# Patient Record
Sex: Female | Born: 1955 | Race: Black or African American | Hispanic: No | Smoking: Never smoker
Health system: Southern US, Community
[De-identification: ages and names within clinical notes are randomized; demographics above are authoritative.]

## PROBLEM LIST (undated history)

## (undated) DIAGNOSIS — N879 Dysplasia of cervix uteri, unspecified: Secondary | ICD-10-CM

## (undated) DIAGNOSIS — D219 Benign neoplasm of connective and other soft tissue, unspecified: Secondary | ICD-10-CM

## (undated) DIAGNOSIS — N946 Dysmenorrhea, unspecified: Secondary | ICD-10-CM

## (undated) DIAGNOSIS — E785 Hyperlipidemia, unspecified: Secondary | ICD-10-CM

## (undated) DIAGNOSIS — E559 Vitamin D deficiency, unspecified: Secondary | ICD-10-CM

## (undated) HISTORY — DX: Vitamin D deficiency, unspecified: E55.9

## (undated) HISTORY — DX: Dysmenorrhea, unspecified: N94.6

## (undated) HISTORY — PX: FOOT SURGERY: SHX648

## (undated) HISTORY — PX: TUBAL LIGATION: SHX77

## (undated) HISTORY — PX: COLPOSCOPY: SHX161

## (undated) HISTORY — DX: Dysplasia of cervix uteri, unspecified: N87.9

## (undated) HISTORY — DX: Benign neoplasm of connective and other soft tissue, unspecified: D21.9

## (undated) HISTORY — DX: Hyperlipidemia, unspecified: E78.5

## (undated) HISTORY — PX: CATARACT EXTRACTION: SUR2

---

## 1997-02-17 HISTORY — PX: VAGINAL HYSTERECTOMY: SUR661

## 1997-02-17 HISTORY — PX: TOTAL VAGINAL HYSTERECTOMY: SHX2548

## 1997-06-22 ENCOUNTER — Other Ambulatory Visit: Admission: RE | Admit: 1997-06-22 | Discharge: 1997-06-22 | Payer: Self-pay | Admitting: Obstetrics and Gynecology

## 1997-08-08 ENCOUNTER — Inpatient Hospital Stay (HOSPITAL_COMMUNITY): Admission: EM | Admit: 1997-08-08 | Discharge: 1997-08-09 | Payer: Self-pay | Admitting: Obstetrics and Gynecology

## 1998-03-29 ENCOUNTER — Other Ambulatory Visit: Admission: RE | Admit: 1998-03-29 | Discharge: 1998-03-29 | Payer: Self-pay | Admitting: Obstetrics and Gynecology

## 1999-04-16 ENCOUNTER — Other Ambulatory Visit: Admission: RE | Admit: 1999-04-16 | Discharge: 1999-04-16 | Payer: Self-pay | Admitting: Obstetrics and Gynecology

## 2000-03-25 ENCOUNTER — Ambulatory Visit (HOSPITAL_COMMUNITY): Admission: RE | Admit: 2000-03-25 | Discharge: 2000-03-25 | Payer: Self-pay | Admitting: Obstetrics and Gynecology

## 2000-03-25 ENCOUNTER — Encounter: Payer: Self-pay | Admitting: Obstetrics and Gynecology

## 2000-04-16 ENCOUNTER — Other Ambulatory Visit: Admission: RE | Admit: 2000-04-16 | Discharge: 2000-04-16 | Payer: Self-pay | Admitting: Obstetrics and Gynecology

## 2001-04-01 ENCOUNTER — Ambulatory Visit (HOSPITAL_COMMUNITY): Admission: RE | Admit: 2001-04-01 | Discharge: 2001-04-01 | Payer: Self-pay | Admitting: Obstetrics and Gynecology

## 2001-04-01 ENCOUNTER — Encounter: Payer: Self-pay | Admitting: Obstetrics and Gynecology

## 2001-04-16 ENCOUNTER — Other Ambulatory Visit: Admission: RE | Admit: 2001-04-16 | Discharge: 2001-04-16 | Payer: Self-pay | Admitting: Obstetrics and Gynecology

## 2002-04-04 ENCOUNTER — Ambulatory Visit (HOSPITAL_COMMUNITY): Admission: RE | Admit: 2002-04-04 | Discharge: 2002-04-04 | Payer: Self-pay | Admitting: Obstetrics and Gynecology

## 2002-04-04 ENCOUNTER — Encounter: Payer: Self-pay | Admitting: Obstetrics and Gynecology

## 2002-04-19 ENCOUNTER — Other Ambulatory Visit: Admission: RE | Admit: 2002-04-19 | Discharge: 2002-04-19 | Payer: Self-pay | Admitting: Obstetrics and Gynecology

## 2003-04-14 ENCOUNTER — Ambulatory Visit (HOSPITAL_COMMUNITY): Admission: RE | Admit: 2003-04-14 | Discharge: 2003-04-14 | Payer: Self-pay | Admitting: Obstetrics and Gynecology

## 2003-04-19 ENCOUNTER — Other Ambulatory Visit: Admission: RE | Admit: 2003-04-19 | Discharge: 2003-04-19 | Payer: Self-pay | Admitting: Obstetrics and Gynecology

## 2004-05-24 ENCOUNTER — Other Ambulatory Visit: Admission: RE | Admit: 2004-05-24 | Discharge: 2004-05-24 | Payer: Self-pay | Admitting: Addiction Medicine

## 2005-06-16 ENCOUNTER — Other Ambulatory Visit: Admission: RE | Admit: 2005-06-16 | Discharge: 2005-06-16 | Payer: Self-pay | Admitting: Obstetrics and Gynecology

## 2006-07-20 ENCOUNTER — Other Ambulatory Visit: Admission: RE | Admit: 2006-07-20 | Discharge: 2006-07-20 | Payer: Self-pay | Admitting: Obstetrics and Gynecology

## 2007-01-07 ENCOUNTER — Ambulatory Visit: Payer: Self-pay | Admitting: Family Medicine

## 2007-01-27 ENCOUNTER — Ambulatory Visit: Payer: Self-pay | Admitting: Gastroenterology

## 2007-01-27 LAB — CONVERTED CEMR LAB
Ferritin: 49.5 ng/mL (ref 10.0–291.0)
Hgb F Quant: 1.1 % (ref 0.0–2.0)
Hgb S Quant: 0 % (ref 0.0–0.0)

## 2007-02-03 ENCOUNTER — Emergency Department (HOSPITAL_COMMUNITY): Admission: EM | Admit: 2007-02-03 | Discharge: 2007-02-03 | Payer: Self-pay | Admitting: Emergency Medicine

## 2007-02-15 DIAGNOSIS — R141 Gas pain: Secondary | ICD-10-CM | POA: Insufficient documentation

## 2007-02-15 DIAGNOSIS — R079 Chest pain, unspecified: Secondary | ICD-10-CM | POA: Insufficient documentation

## 2007-02-15 DIAGNOSIS — R142 Eructation: Secondary | ICD-10-CM

## 2007-02-15 DIAGNOSIS — K5909 Other constipation: Secondary | ICD-10-CM

## 2007-02-15 DIAGNOSIS — R143 Flatulence: Secondary | ICD-10-CM

## 2007-02-25 ENCOUNTER — Ambulatory Visit: Payer: Self-pay | Admitting: Gastroenterology

## 2007-07-08 ENCOUNTER — Ambulatory Visit (HOSPITAL_COMMUNITY): Admission: RE | Admit: 2007-07-08 | Discharge: 2007-07-08 | Payer: Self-pay | Admitting: Obstetrics and Gynecology

## 2007-09-02 ENCOUNTER — Other Ambulatory Visit: Admission: RE | Admit: 2007-09-02 | Discharge: 2007-09-02 | Payer: Self-pay | Admitting: Obstetrics and Gynecology

## 2007-12-10 ENCOUNTER — Ambulatory Visit: Payer: Self-pay | Admitting: Family Medicine

## 2008-09-15 ENCOUNTER — Encounter: Payer: Self-pay | Admitting: Obstetrics and Gynecology

## 2008-09-15 ENCOUNTER — Other Ambulatory Visit: Admission: RE | Admit: 2008-09-15 | Discharge: 2008-09-15 | Payer: Self-pay | Admitting: Obstetrics and Gynecology

## 2008-09-15 ENCOUNTER — Ambulatory Visit: Payer: Self-pay | Admitting: Obstetrics and Gynecology

## 2008-12-17 IMAGING — CR DG CHEST 2V
2 series · 2 of 2 positions shown · non-contrast
Comparison: None

CLINICAL DATA: Chest pain

CHEST - 2 VIEW:

[w chest pa]
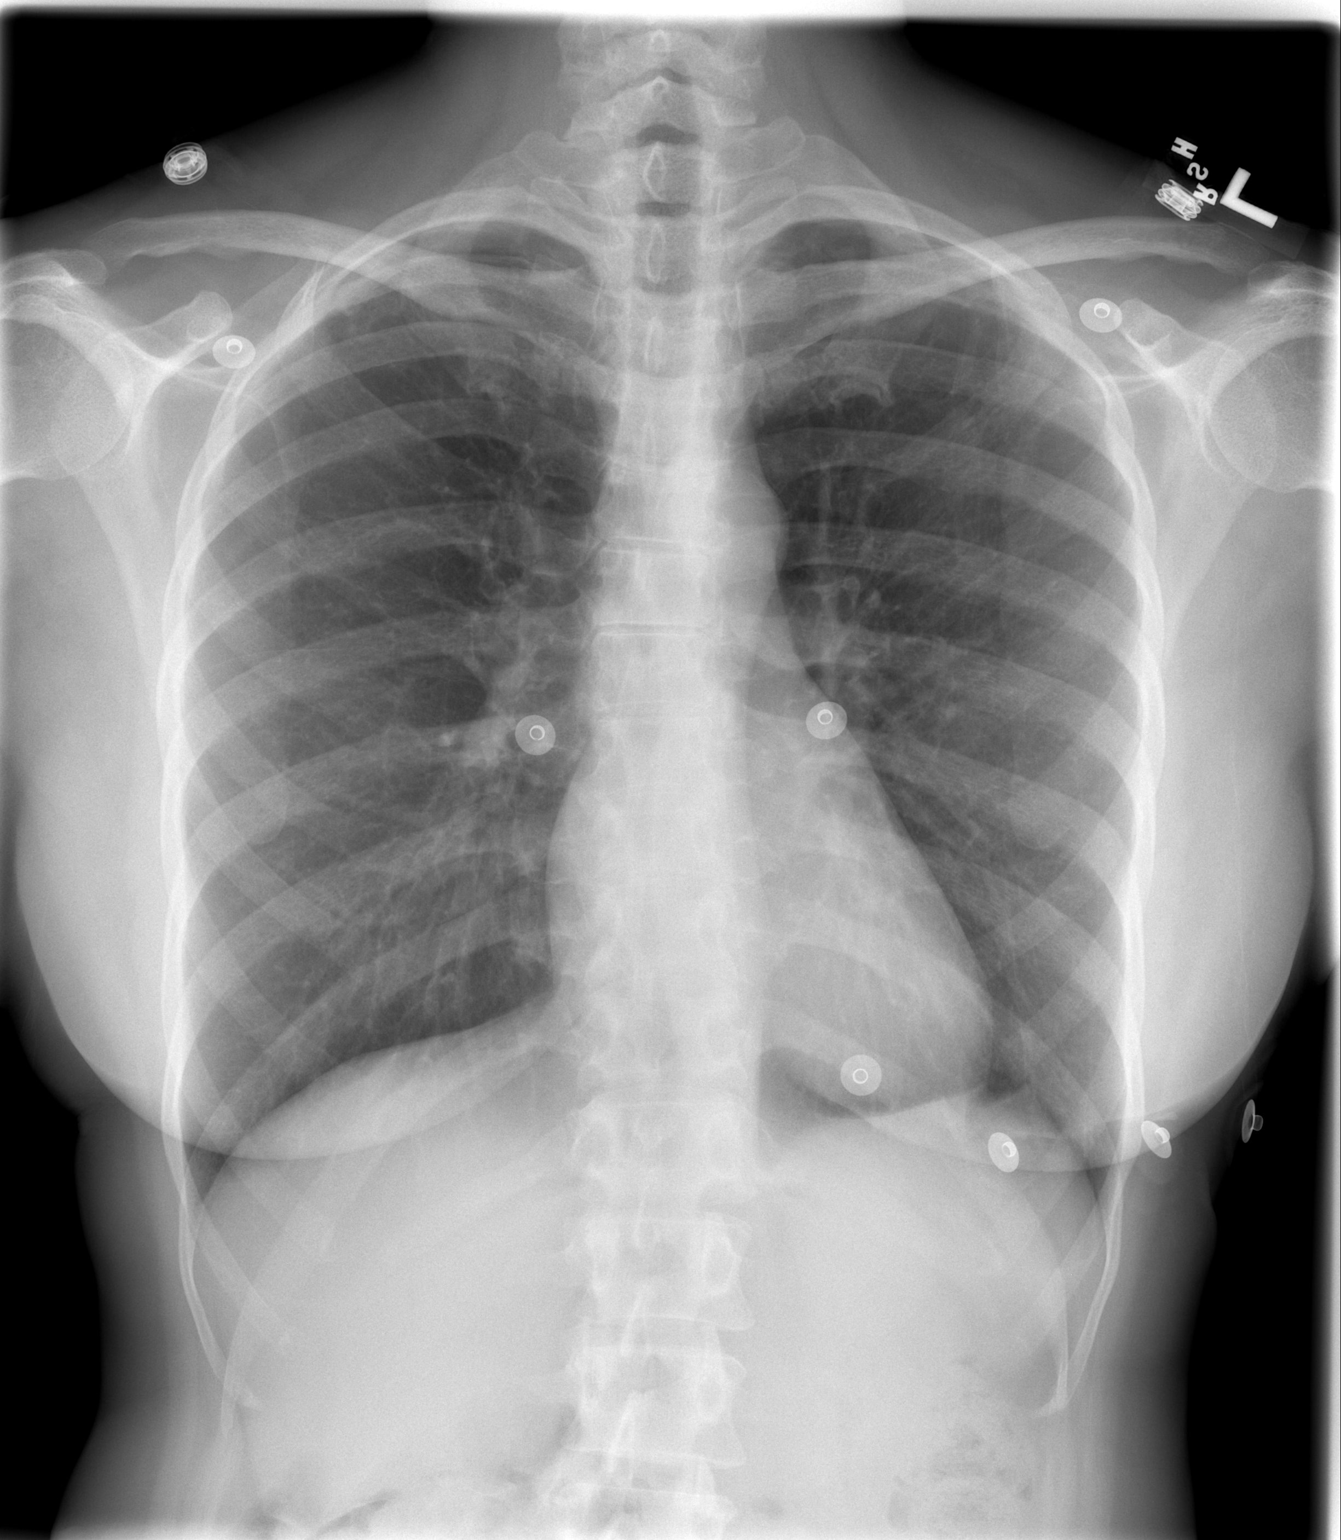

[w chest lat]
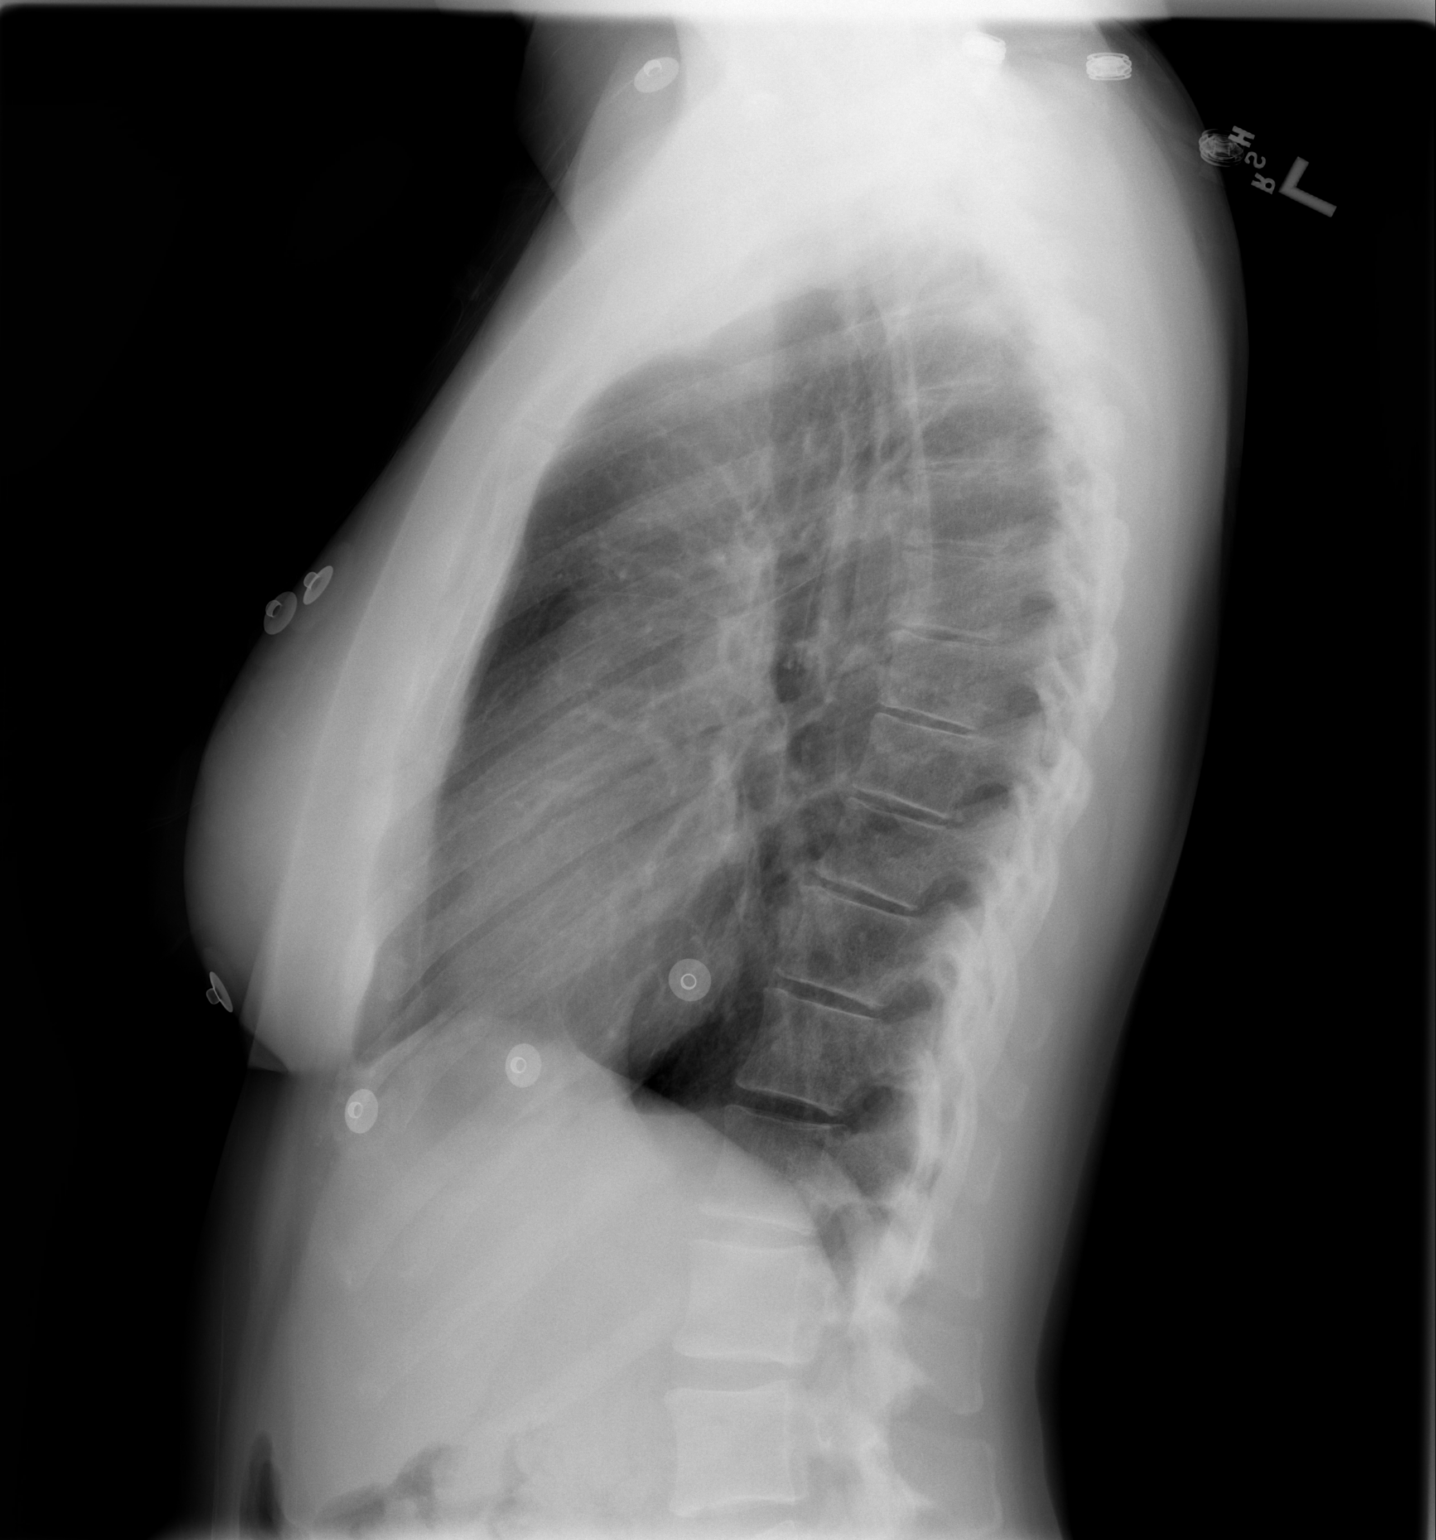

[2 of 2 positions shown; findings below may reference images not displayed]

FINDINGS: The heart size and mediastinal contours are within normal limits. 
Biapical scarring noted. Otherwise lungs are clear. . No effusions.  The
visualized skeletal structures are unremarkable.
IMPRESSION: No active cardiopulmonary disease

## 2008-12-21 ENCOUNTER — Ambulatory Visit (HOSPITAL_COMMUNITY): Admission: RE | Admit: 2008-12-21 | Discharge: 2008-12-21 | Payer: Self-pay | Admitting: Obstetrics and Gynecology

## 2008-12-27 ENCOUNTER — Encounter: Admission: RE | Admit: 2008-12-27 | Discharge: 2008-12-27 | Payer: Self-pay | Admitting: Obstetrics and Gynecology

## 2009-01-02 ENCOUNTER — Ambulatory Visit: Payer: Self-pay | Admitting: Family Medicine

## 2009-07-02 ENCOUNTER — Encounter: Admission: RE | Admit: 2009-07-02 | Discharge: 2009-07-02 | Payer: Self-pay | Admitting: Obstetrics and Gynecology

## 2009-09-26 ENCOUNTER — Other Ambulatory Visit: Admission: RE | Admit: 2009-09-26 | Discharge: 2009-09-26 | Payer: Self-pay | Admitting: Obstetrics and Gynecology

## 2009-09-26 ENCOUNTER — Ambulatory Visit: Payer: Self-pay | Admitting: Obstetrics and Gynecology

## 2010-01-14 ENCOUNTER — Ambulatory Visit: Payer: Self-pay | Admitting: Obstetrics and Gynecology

## 2010-03-10 ENCOUNTER — Encounter: Payer: Self-pay | Admitting: Obstetrics and Gynecology

## 2010-07-02 NOTE — Letter (Signed)
January 27, 2007    Orlin Hilding   RE:  ELEANOR, GATLIFF  MRN:  [id number]  /  DOB:  06/23/1955   Dear Mrs. Neva Seat:   It is my pleasure to have treated you recently as a new patient in my  office.  I appreciate your confidence and the opportunity to participate  in your care.   Since I do have a busy inpatient endoscopy schedule and office schedule,  my office hours vary weekly.  I am, however, available for emergency  calls every day through my office.  If I cannot promptly meet an urgent  office appointment, another one of our gastroenterologists will be able  to assist you.   My well-trained staff are prepared to help you at all times.  For  emergencies after office hours, a physician from our gastroenterology  section is always available through my 24-hour answering service.   While you are under my care, I encourage discussion of your questions  and concerns, and I will be happy to return your calls as soon as I am  available.   Once again, I welcome you as a new patient and I look forward to a happy  and healthy relationship.    Sincerely,      Barbette Hair. Arlyce Dice, MD,FACG  Electronically Signed   RDK/MedQ  DD: 01/27/2007  DT: 01/28/2007  Job #: [id number]

## 2010-07-02 NOTE — Assessment & Plan Note (Signed)
[facility]                         GASTROENTEROLOGY OFFICE NOTE   NAME:Roman, Barbara LOOPER                     MRN:          [id number]  DATE:01/27/2007                            DOB:          Dec 14, 1955    REASON FOR CONSULTATION:  Constipation.   Barbara Roman is a pleasant 55 year old African American female referred  through the courtesy of Dr. Susann Givens for evaluation.  For years, she has  been suffering from constipation.  She has to take a cathartic to have a  bowel movement, or she may go up to a week without a bowel movement.  She has abdominal distention and discomfort associated with this  problem.  There is no history of melena or hematochezia.  She does  complain of abdominal bloating with her constipation.  Recent lab work  is pertinent for a hemoglobin of 13.1, with an MCV of 77.  Barbara Roman  reports that a sister has what sounds like sickle cell trait.  She  denies melena or hematochezia.  She underwent a hysterectomy 20 years  ago and has had no vaginal bleeding.   PAST MEDICAL HISTORY:  1. Hysterectomy.  2. Tubal ligation.   FAMILY HISTORY:  Positive for a mother with uterine cancer, and father  with heart disease and diabetes.   MEDICATIONS:  Premarin.   She has no allergies.   She neither smokes nor drinks.   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   REVIEW OF SYSTEMS:  Positive for joint and back pain and muscle cramps.   PHYSICAL EXAMINATION:  VITAL SIGNS:  Pulse 80, blood pressure 100/70,  weight 122.  HEENT: EOMI.  PERRLA.  Sclerae are anicteric.  Conjunctivae are pink.  NECK:  Supple without thyromegaly, adenopathy or carotid bruits.  CHEST:  Clear to auscultation and percussion without adventitious  sounds.  CARDIAC:  Regular rhythm; normal S1 S2.  There are no murmurs, gallops  or rubs.  ABDOMEN:  Bowel sounds are normoactive.  Abdomen is soft, nontender and  nondistended.  There are no abdominal  masses, tenderness, splenic  enlargement or hepatomegaly.  EXTREMITIES:  Full range of motion.  No cyanosis, clubbing or edema.  RECTAL:  Deferred.   IMPRESSION:  1. Constipation.  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  2. Microcytosis.  I suspect she may have sickle trait or perhaps      thalassemia trait.  Iron-deficiency is another consideration.   RECOMMENDATIONS:  1. Fiber supplementation.  2. Colonoscopy.  3. Check hemoglobin electrophoresis, iron, and ferritin levels.  4. If the colonoscopy is negative, I will work on a bowel regimen with      Barbara Roman.     Barbette Hair. Arlyce Dice, MD,FACG  Electronically Signed    RDK/MedQ  DD: 01/27/2007  DT: 01/28/2007  Job #: [id number]   cc:   Sharlot Gowda, M.D.

## 2010-07-02 NOTE — Letter (Signed)
January 27, 2007    Sharlot Gowda, M.D.  [address]  [place], [place] [address]   RE:  Barbara Roman, Barbara Roman  MRN:  [id number]  /  DOB:  Oct 16, 1955   Dear Dr. Susann Givens,   Upon your kind referral, I had the pleasure of evaluating your patient  and I am pleased to offer my findings.  I saw Orlin Hilding in the  office today.  Enclosed is a copy of my progress note that details my  findings and recommendations.   Thank you for the opportunity to participate in your patient's care.    Sincerely,      Barbette Hair. Arlyce Dice, MD,FACG  Electronically Signed    RDK/MedQ  DD: 01/27/2007  DT: 01/28/2007  Job #: [id number]

## 2010-07-29 ENCOUNTER — Other Ambulatory Visit: Payer: Self-pay | Admitting: Obstetrics and Gynecology

## 2010-07-29 DIAGNOSIS — Z1231 Encounter for screening mammogram for malignant neoplasm of breast: Secondary | ICD-10-CM

## 2010-08-06 ENCOUNTER — Ambulatory Visit (HOSPITAL_COMMUNITY)
Admission: RE | Admit: 2010-08-06 | Discharge: 2010-08-06 | Disposition: A | Discharge status: Home / Self Care | Payer: BC Managed Care – PPO | Source: Ambulatory Visit | Attending: Obstetrics and Gynecology | Admitting: Obstetrics and Gynecology

## 2010-08-06 DIAGNOSIS — Z1231 Encounter for screening mammogram for malignant neoplasm of breast: Secondary | ICD-10-CM | POA: Insufficient documentation

## 2010-09-30 ENCOUNTER — Encounter: Payer: BC Managed Care – PPO | Admitting: Obstetrics and Gynecology

## 2010-10-09 ENCOUNTER — Encounter: Payer: BC Managed Care – PPO | Admitting: Obstetrics and Gynecology

## 2010-10-14 ENCOUNTER — Encounter: Payer: Self-pay | Admitting: Gynecology

## 2010-10-14 DIAGNOSIS — D219 Benign neoplasm of connective and other soft tissue, unspecified: Secondary | ICD-10-CM | POA: Insufficient documentation

## 2010-10-14 DIAGNOSIS — N946 Dysmenorrhea, unspecified: Secondary | ICD-10-CM | POA: Insufficient documentation

## 2010-10-23 ENCOUNTER — Other Ambulatory Visit: Payer: Self-pay

## 2010-10-23 ENCOUNTER — Encounter: Payer: Self-pay | Admitting: Obstetrics and Gynecology

## 2010-10-23 ENCOUNTER — Other Ambulatory Visit (HOSPITAL_COMMUNITY)
Admission: RE | Admit: 2010-10-23 | Discharge: 2010-10-23 | Disposition: A | Discharge status: Home / Self Care | Payer: BC Managed Care – PPO | Source: Ambulatory Visit | Attending: Obstetrics and Gynecology | Admitting: Obstetrics and Gynecology

## 2010-10-23 ENCOUNTER — Ambulatory Visit (INDEPENDENT_AMBULATORY_CARE_PROVIDER_SITE_OTHER): Payer: BC Managed Care – PPO | Admitting: Obstetrics and Gynecology

## 2010-10-23 VITALS — BP 114/70 | Ht 63.0 in | Wt 132.0 lb

## 2010-10-23 DIAGNOSIS — Z01419 Encounter for gynecological examination (general) (routine) without abnormal findings: Secondary | ICD-10-CM

## 2010-10-23 DIAGNOSIS — B373 Candidiasis of vulva and vagina: Secondary | ICD-10-CM

## 2010-10-23 DIAGNOSIS — N951 Menopausal and female climacteric states: Secondary | ICD-10-CM

## 2010-10-23 DIAGNOSIS — E7800 Pure hypercholesterolemia, unspecified: Secondary | ICD-10-CM

## 2010-10-23 DIAGNOSIS — Z780 Asymptomatic menopausal state: Secondary | ICD-10-CM

## 2010-10-23 DIAGNOSIS — Z833 Family history of diabetes mellitus: Secondary | ICD-10-CM

## 2010-10-23 MED ORDER — TERCONAZOLE 0.8 % VA CREA: 1.0000 | TOPICAL_CREAM | Freq: Every day | VAGINAL | Status: AC

## 2010-10-23 MED ORDER — ESTRADIOL 1 MG PO TABS: 1.0000 mg | ORAL_TABLET | Freq: Every day | ORAL | Status: DC

## 2010-10-23 NOTE — Progress Notes (Signed)
Patient came to see me today for an annual GYN exam. She continues to do well on her estradiol. She still has hot flashes 2-3 times a month but thinks they're tolerable. She is up-to-date on mammograms and bone densities. She says she does go a yeast infections. She is having no vaginal bleeding and no pelvic pain.  Past medical history, family history, and social history reviewed and in chart.  ROS: other pertinent positives in addition to above included some weight gain, diminished hair growth, belching and constipation.  HEENT: Within normal limits. Neck: No masses. Supraclavicular lymph nodes: Not enlarged. Breasts: Examined in both sitting and lying position. Symmetrical without skin changes or masses. Abdomen: Soft no masses guarding or rebound. No hernias. Pelvic: External within normal limits. BUS within normal limits. Vaginal examination shows good estrogen effect, no cystocele enterocele or rectocele. Cervix and uterus absent. Adnexa within normal limits. Rectovaginal confirmatory. Extremities within normal limits.   Assessment: 1. Menopausal symptoms 2. Recurrent yeast infections  Plan: Discussed switching to patch estrogen. Patient to research the cost and will let me know. For the moment she'll stay on oral estradiol. Discussed oral rephresh. Prescription for terconazole 3 cream given.

## 2010-10-28 ENCOUNTER — Other Ambulatory Visit: Payer: Self-pay | Admitting: Obstetrics and Gynecology

## 2010-11-22 LAB — I-STAT 8, (EC8 V) (CONVERTED LAB)
Acid-base deficit: 1
HCT: 41
Hemoglobin: 13.9
Operator id: 198171
Potassium: 4.3
Sodium: 137
TCO2: 25

## 2010-11-22 LAB — CBC
Hemoglobin: 12.1
RDW: 13.7

## 2010-11-22 LAB — DIFFERENTIAL
Basophils Absolute: 0
Lymphocytes Relative: 32
Monocytes Absolute: 0.4
Neutro Abs: 2.9

## 2010-11-22 LAB — D-DIMER, QUANTITATIVE: D-Dimer, Quant: <0.22

## 2011-02-21 ENCOUNTER — Telehealth: Payer: Self-pay | Admitting: *Deleted

## 2011-02-21 MED ORDER — ESTROGENS CONJUGATED 0.625 MG PO TABS: 0.6250 mg | ORAL_TABLET | Freq: Every day | ORAL | Status: DC

## 2011-02-21 NOTE — Telephone Encounter (Signed)
Pt called stating that her estradiol 1 mg is on back order until mid Jan. Per DG protocol pt can have premarin .625 which is the equivalent dose of 1 mg estradiol. Rx will be sent to pharmacy, pt informed with this as well.

## 2011-02-26 ENCOUNTER — Encounter: Payer: Self-pay | Admitting: *Deleted

## 2011-02-26 NOTE — Progress Notes (Signed)
Patient ID: Barbara Roman, female   DOB: [birth year], 56 y.o.   MRN: [id number] Pt called wanting to know if we had samples of premarin in office, pt informed that no samples are available.

## 2011-03-18 ENCOUNTER — Other Ambulatory Visit: Payer: Self-pay | Admitting: *Deleted

## 2011-03-18 DIAGNOSIS — E785 Hyperlipidemia, unspecified: Secondary | ICD-10-CM

## 2011-06-12 ENCOUNTER — Encounter: Payer: Self-pay | Admitting: Family Medicine

## 2011-06-12 ENCOUNTER — Ambulatory Visit (INDEPENDENT_AMBULATORY_CARE_PROVIDER_SITE_OTHER): Payer: BC Managed Care – PPO | Admitting: Family Medicine

## 2011-06-12 VITALS — BP 116/80 | HR 75 | Wt 132.0 lb

## 2011-06-12 DIAGNOSIS — R142 Eructation: Secondary | ICD-10-CM

## 2011-06-12 DIAGNOSIS — K30 Functional dyspepsia: Secondary | ICD-10-CM

## 2011-06-12 DIAGNOSIS — K3189 Other diseases of stomach and duodenum: Secondary | ICD-10-CM

## 2011-06-12 DIAGNOSIS — R141 Gas pain: Secondary | ICD-10-CM

## 2011-06-12 NOTE — Progress Notes (Signed)
  Subjective:    Patient ID: Barbara Roman, female    DOB: 1955-12-20, 56 y.o.   MRN: [id number]  HPI She complains of a several year history of what excessive eructation. She cannot relate this to greasy foods, spicy foods, milk or milk products. She did try one Zantac yesterday and states that she did get some relief of her symptoms. She has had no nausea, vomiting, weight change.   Review of Systems     Objective:   Physical Exam Alert and in no distress. Cardiac exam shows regular rhythm without murmurs or gallops. Lungs are clear to auscultation. Abdominal exam shows normal bowel sounds without masses or tenderness. Negative Murphy sign and Murphy's punch       Assessment & Plan:  Excessive eructation, etiology unclear.   She is to keep track of her symptoms in regard to any foods or spices that might bring this on also milk and milk products. Recommend she try an H2 blocker of her choice and let me now how this works.

## 2011-06-12 NOTE — Patient Instructions (Signed)
See if greasy foods make a difference or milk or milk products make a difference. Try Zantac, Pepcid, Axid regularly for the next couple of weeks to see what that will do. Sometimes different spices can cause trouble

## 2011-10-10 ENCOUNTER — Ambulatory Visit (INDEPENDENT_AMBULATORY_CARE_PROVIDER_SITE_OTHER): Payer: BC Managed Care – PPO | Admitting: Medical

## 2011-10-10 ENCOUNTER — Encounter: Payer: Self-pay | Admitting: Medical

## 2011-10-10 VITALS — BP 120/70 | HR 58 | Temp 98.1°F | Resp 14 | Wt 124.0 lb

## 2011-10-10 DIAGNOSIS — K111 Hypertrophy of salivary gland: Secondary | ICD-10-CM

## 2011-10-10 DIAGNOSIS — H1045 Other chronic allergic conjunctivitis: Secondary | ICD-10-CM

## 2011-10-10 DIAGNOSIS — H1010 Acute atopic conjunctivitis, unspecified eye: Secondary | ICD-10-CM

## 2011-10-10 NOTE — Patient Instructions (Signed)
Begin Benadryl or Zyrtec at night for the next few weeks for possible allergies to grass or other pollen.  Begin Aleve twice daily the next 5-7 days for gland swelling.   Drink plenty of fluids.  We discussed possible diagnosis today. Given the possible diagnosis that could include mumps, avoid young children and adults with decreased immune systems.    If not improving in the next 1-2 weeks, or if new or worsening symptoms, call or return.   Mumps Mumps is an infection caused by a type of germ (virus). Mumps is usually seen in children between the ages of 56 to 71 years of age, but it can occur in older teenagers and adults. Mumps is common worldwide. Vaccination in one country may not protect you against the mumps virus in a different country. Your caregiver may have specific recommendations. CAUSES  The mumps is caused by direct contact with an infected person. The person you get the mumps from may not have had any symptoms at the time that you or your child came in contact with them. That is because the length of time between being exposed to an illness and when symptoms occur (incubation period) ranges from 2 to 3 weeks. SYMPTOMS   Painful enlargement of the salivary glands, especially the parotid gland. The parotid gland is a large gland that lies just behind the upper jaw. The swelling usually occurs over several days and often begins on one side of the face. The swelling goes away in about 1 week.   Muscular aches and pains.   Fever.   Headache.   Abdominal pain.   Loss of appetite.   General tiredness (malaise).   Males (usually over the age of 10 years) may have severe pain of their testicles on one or both sides.  DIAGNOSIS  A caregiver will usually perform a physical exam. Blood tests can help confirm the diagnosis when necessary. A caregiver will decide if any additional blood tests are necessary to look for rare complications of the illness. Generally, viral cultures and  lab tests are not needed.  TREATMENT Treatment is symptomatic. This means that treatment can only help improve symptoms. There is no medication to treat Mumps. HOME CARE INSTRUCTIONS   A caregiver may recommend immunizations if there is a mumps outbreak.   Keep the infected person away from others, especially those who have not had their full course of vaccines or are pregnant.   School or daycare should be avoided for 9 days from the onset of swollen glands or as directed by a caregiver.   Wash your hand well at home. This will help prevent the spread of the virus.   Get plenty of rest.   Drink enough fluids to keep your urine clear or pale yellow.   A soft diet is helpful for jaw pain.   Avoid food and fluids that are acidic as they will upset the stomach and worsen mouth pain. Examples include:   Orange juice.   Tomatoes.   Products containing vinegar.   Only take over-the-counter or prescription medicines for pain, discomfort, or fever as directed by your caregiver. Do not give aspirin to children.  SEEK MEDICAL CARE IF:   You or your child has an oral temperature above 102 F (38.9 C).   A severe headache develops.   You or your child has weakness.   You or your child becomes confused.   You or your child keeps throwing up (vomiting).   Ringing in the  ears develops.   You or your child has neck pain or stiff neck.   There is pain in the testicles.  MAKE SURE YOU:   Understand these instructions.   Will watch your condition.   Will get help right away if you are not doing well or get worse.  Document Released: 02/03/2005 Document Revised: 01/23/2011 Document Reviewed: 09/21/2008 [facility] Patient Information 2012 [place], [place].

## 2011-10-10 NOTE — Progress Notes (Signed)
Subjective: Here for 3 wk hx/o eyes puffy and now glands are swollen.  Eyes have been puffy for 3 wk, but in the last week she notes fever 99.5-99.7 in the evenings when she comes home from work, having some chills, and the neck and face glands are swollen.  Denies prior similar.  otherwise feels fine.    Past Medical History  Diagnosis Date  . Fibroid   . Dysmenorrhea    ROS Gen: no fatigue, no weight loss Skin: no rash HEENT: no sinus pressure, runny nose, watery eyes, ear pain, ST, cough Lungs: negative Heart: negative GI: negative Neuro: no weakness, numbness, tingling, headache  Objective: Gen: wd, wn, nad Skin: unremarkable HEENT: bilat preauricular area and along parotid gland with swollen shoddy nodes and mildly swollen bilat parotid glands.  Mildly tender.  No warmth, no lymph nodes > 1cm.  Head nontender, TMs pearly, nares patent, but turbinates pink and swollen, pharynx WNL Lungs: CTA   Assessment: Encounter Diagnoses  Name Primary?  . Parotid gland enlargement Yes  . Allergic conjunctivitis     Plan: Parotid gland enlargement - discussed possible differential, parotitis, viral, mumps, bacterial, or blocked parotid ducts.  Exam and hx/o favors viral or mumps.  Labs today to further eval.  Advised supportive care, aleve, hydrate well, and call if worse.  Advised if red, warm, swollen glands worrisome for bacterial infection or abscess to begin Augmentin.  Script written for just in case use.    Allergic conjunctivitis - begin Zyrtec or Benadryl QHS x 3-4 wk, recheck if not improving.

## 2011-11-12 ENCOUNTER — Encounter: Payer: Self-pay | Admitting: Obstetrics and Gynecology

## 2011-11-12 ENCOUNTER — Ambulatory Visit (INDEPENDENT_AMBULATORY_CARE_PROVIDER_SITE_OTHER): Payer: BC Managed Care – PPO | Admitting: Obstetrics and Gynecology

## 2011-11-12 VITALS — BP 118/74 | Ht 63.0 in | Wt 117.0 lb

## 2011-11-12 DIAGNOSIS — Z01419 Encounter for gynecological examination (general) (routine) without abnormal findings: Secondary | ICD-10-CM

## 2011-11-12 DIAGNOSIS — N879 Dysplasia of cervix uteri, unspecified: Secondary | ICD-10-CM | POA: Insufficient documentation

## 2011-11-12 MED ORDER — ESTRADIOL 1 MG PO TABS: 1.0000 mg | ORAL_TABLET | Freq: Every day | ORAL | Status: DC

## 2011-11-12 NOTE — Patient Instructions (Addendum)
Discuss  with aunt testing for BRCA1 and BRCA2. Schedule mammogram.

## 2011-11-12 NOTE — Progress Notes (Signed)
Patient came to see me today for her annual GYN exam. She remains at estradiol 1 mg for menopausal symptoms with excellent results. She had a normal bone density in 2009. She had a vaginal hysterectomy in 1999 for dysmenorrhea and fibroids. In 1992 we did a LEEP for cervical dysplasia. She has had normal Pap smears since then. Her cervix was benign at the time of her hysterectomy. Her last Pap smear was 2012. She is due for her mammogram. She has been under a lot of stress since her husband had a stroke this year. She is having no vaginal bleeding or pelvic pain. She had a maternal aunt who had breast cancer in her 66s. At that time she was not checked for BRCA1 or BRCA2. She is still alive and lives and [place]. She does her lab through PCP. She had normal bone density in 2009.  HEENT: Within normal limits.Kennon Portela present. Neck: No masses. Supraclavicular lymph nodes: Not enlarged. Breasts: Examined in both sitting and lying position. Symmetrical without skin changes or masses. Abdomen: Soft no masses guarding or rebound. No hernias. Pelvic: External within normal limits. BUS within normal limits. Vaginal examination shows good estrogen effect, no cystocele enterocele or rectocele. Cervix and uterus absent. Adnexa within normal limits. Rectovaginal confirmatory. Extremities within normal limits.  Assessment: #1. Menopausal symptoms #2. Cervical dysplasia #3. Maternal and with early onset breast cancer.  Plan: Mammogram. Patient to get aunt tested for BRCA1 and BRCA2 and inform me. She understands the increased risk of ovarian cancer if Maryem is Bracca  positive. Continue estradiol 1 mg daily.The new Pap smear guidelines were discussed with the patient. No pap done.

## 2011-11-13 LAB — URINALYSIS W MICROSCOPIC + REFLEX CULTURE
Bacteria, UA: NONE SEEN
Bilirubin Urine: NEGATIVE
Casts: NONE SEEN
Crystals: NONE SEEN
Ketones, ur: NEGATIVE mg/dL
Specific Gravity, Urine: 1.011 (ref 1.005–1.030)
Squamous Epithelial / LPF: NONE SEEN
pH: 6.5 (ref 5.0–8.0)

## 2011-11-17 ENCOUNTER — Encounter: Payer: Self-pay | Admitting: Obstetrics and Gynecology

## 2011-12-24 ENCOUNTER — Other Ambulatory Visit: Payer: Self-pay | Admitting: Obstetrics and Gynecology

## 2011-12-24 DIAGNOSIS — Z1231 Encounter for screening mammogram for malignant neoplasm of breast: Secondary | ICD-10-CM

## 2011-12-24 DIAGNOSIS — Z803 Family history of malignant neoplasm of breast: Secondary | ICD-10-CM

## 2012-01-05 ENCOUNTER — Telehealth: Payer: Self-pay | Admitting: *Deleted

## 2012-01-05 MED ORDER — ESTRADIOL 1 MG PO TABS: 1.0000 mg | ORAL_TABLET | Freq: Every day | ORAL | Status: DC

## 2012-01-05 NOTE — Telephone Encounter (Signed)
Pt called requesting refill on her estradiol 1 mg tablet. Rx sent.

## 2012-01-14 ENCOUNTER — Ambulatory Visit (HOSPITAL_COMMUNITY)
Admission: RE | Admit: 2012-01-14 | Discharge: 2012-01-14 | Disposition: A | Discharge status: Home / Self Care | Payer: BC Managed Care – PPO | Source: Ambulatory Visit | Attending: Obstetrics and Gynecology | Admitting: Obstetrics and Gynecology

## 2012-01-14 DIAGNOSIS — Z803 Family history of malignant neoplasm of breast: Secondary | ICD-10-CM

## 2012-01-14 DIAGNOSIS — Z1231 Encounter for screening mammogram for malignant neoplasm of breast: Secondary | ICD-10-CM | POA: Insufficient documentation

## 2012-04-16 ENCOUNTER — Encounter: Payer: Self-pay | Admitting: Medical

## 2012-04-16 ENCOUNTER — Ambulatory Visit (INDEPENDENT_AMBULATORY_CARE_PROVIDER_SITE_OTHER): Payer: BC Managed Care – PPO | Admitting: Medical

## 2012-04-16 VITALS — BP 100/70 | HR 73 | Temp 98.2°F | Resp 16 | Wt 119.0 lb

## 2012-04-16 DIAGNOSIS — R079 Chest pain, unspecified: Secondary | ICD-10-CM

## 2012-04-16 DIAGNOSIS — J069 Acute upper respiratory infection, unspecified: Secondary | ICD-10-CM

## 2012-04-16 NOTE — Progress Notes (Signed)
Subjective:  Barbara Roman is a 57 y.o. female who presents for cough, congestion, and chest pain.   She notes few day hx/o cough, congestion, runny nose, sneezing, congestion in her head, sleeping with vaporizer, using allergy and flu pills, tussin OTC.   She came is because she had a few brief episodes of chest pain in middle of chest, last seconds to minutes, not associated with SOB, nausea, sweats, dizziness, arm or jaw pain.  Denies sick contacts.  No other aggravating or relieving factors.  No other c/o.  Past Medical History  Diagnosis Date  . Dysmenorrhea   . Fibroid   . Cervical dysplasia    ROS as in subjective  Objective:  Filed Vitals:   04/16/12 1122  BP: 100/70  Pulse: 73  Temp: 98.2 F (36.8 C)  Resp: 16    General appearance: Alert, WD/WN, no distress, mildly ill appearing                             Skin: warm, no rash                           Head: no sinus tenderness                            Eyes: conjunctiva normal, corneas clear, PERRLA                            Ears: pearly TMs, external ear canals normal                          Nose: septum midline, turbinates swollen, with erythema and clear discharge             Mouth/throat: MMM, tongue normal, mild pharyngeal erythema                           Neck: supple, no adenopathy, no thyromegaly, nontender                          Heart: RRR, normal S1, S2, no murmurs                         Lungs: CTA bilaterally, no wheezes, rales, or rhonchi     Chest: nontender   Ext: no edema   Adult ECG Report  Indication: chest pain  Rate: 64 bpm  Rhythm: normal sinus rhythm  QRS Axis: 72  PR Interval: 118 ms  QRS Duration: 72ms  QTc: 379 ms  Conduction Disturbances: none  Other Abnormalities: none  Patient's cardiac risk factors are: none.  EKG comparison: 03/25/02 EKG, no changes  Narrative Interpretation: normal EKG, no acute changes    Assessment and Plan: Encounter Diagnoses  Name Primary?  .  URI (upper respiratory infection) Yes  . Chest pain     Discussed diagnosis and treatment of URI.  Suggested symptomatic OTC remedies.  Nasal saline spray for congestion.  Tylenol or Ibuprofen OTC for fever and malaise.  Call/return in 2-3 days if symptoms aren't resolving.   Chest pain - discussed symptoms, differential, and advised that symptoms dont' suggest cardiac origin.  Reviewed EKG , unchanged from prior.   Chest pain  likely due to chest congestion, reassured.

## 2012-04-20 NOTE — Addendum Note (Signed)
Addended by: Jac Canavan on: 04/20/2012 12:21 PM   Modules accepted: Orders

## 2012-11-12 ENCOUNTER — Ambulatory Visit (INDEPENDENT_AMBULATORY_CARE_PROVIDER_SITE_OTHER): Payer: BC Managed Care – PPO | Admitting: Gynecology

## 2012-11-12 ENCOUNTER — Encounter: Payer: Self-pay | Admitting: Gynecology

## 2012-11-12 VITALS — BP 112/70 | Ht 64.25 in | Wt 127.0 lb

## 2012-11-12 DIAGNOSIS — Z833 Family history of diabetes mellitus: Secondary | ICD-10-CM

## 2012-11-12 DIAGNOSIS — H6191 Disorder of right external ear, unspecified: Secondary | ICD-10-CM

## 2012-11-12 DIAGNOSIS — Z1159 Encounter for screening for other viral diseases: Secondary | ICD-10-CM

## 2012-11-12 DIAGNOSIS — H6190 Disorder of external ear, unspecified, unspecified ear: Secondary | ICD-10-CM

## 2012-11-12 DIAGNOSIS — Z01419 Encounter for gynecological examination (general) (routine) without abnormal findings: Secondary | ICD-10-CM

## 2012-11-12 DIAGNOSIS — Z79890 Hormone replacement therapy (postmenopausal): Secondary | ICD-10-CM

## 2012-11-12 LAB — CBC WITH DIFFERENTIAL/PLATELET
Eosinophils Absolute: 0.1 10*3/uL (ref 0.0–0.7)
Eosinophils Relative: 2 % (ref 0–5)
Hemoglobin: 13.5 g/dL (ref 12.0–15.0)
Lymphocytes Relative: 22 % (ref 12–46)
Lymphs Abs: 0.9 10*3/uL (ref 0.7–4.0)
MCH: 25.6 pg — ABNORMAL LOW (ref 26.0–34.0)
MCV: 74.4 fL — ABNORMAL LOW (ref 78.0–100.0)
Monocytes Relative: 11 % (ref 3–12)
Neutro Abs: 2.7 10*3/uL (ref 1.7–7.7)
Neutrophils Relative %: 64 % (ref 43–77)
Platelets: 283 10*3/uL (ref 150–400)
RBC: 5.28 MIL/uL — ABNORMAL HIGH (ref 3.87–5.11)
RDW: 14.3 % (ref 11.5–15.5)
WBC: 4.2 10*3/uL (ref 4.0–10.5)

## 2012-11-12 LAB — COMPREHENSIVE METABOLIC PANEL
ALT: 10 U/L (ref 0–35)
AST: 18 U/L (ref 0–37)
Alkaline Phosphatase: 92 U/L (ref 39–117)
BUN: 11 mg/dL (ref 6–23)
Chloride: 99 mEq/L (ref 96–112)
Creat: 0.75 mg/dL (ref 0.50–1.10)
Sodium: 138 mEq/L (ref 135–145)
Total Bilirubin: 0.4 mg/dL (ref 0.3–1.2)
Total Protein: 7.6 g/dL (ref 6.0–8.3)

## 2012-11-12 LAB — HEMOGLOBIN A1C
Hgb A1c MFr Bld: 6 % — ABNORMAL HIGH (ref <5.7)
Mean Plasma Glucose: 126 mg/dL — ABNORMAL HIGH (ref <117)

## 2012-11-12 LAB — CHOLESTEROL, TOTAL: Cholesterol: 234 mg/dL — ABNORMAL HIGH (ref 0–200)

## 2012-11-12 LAB — TSH: TSH: 3.84 u[IU]/mL (ref 0.350–4.500)

## 2012-11-12 MED ORDER — ESTRADIOL 1 MG PO TABS: 1.0000 mg | ORAL_TABLET | Freq: Every day | ORAL | Status: DC

## 2012-11-12 NOTE — Patient Instructions (Addendum)

## 2012-11-12 NOTE — Progress Notes (Signed)
Barbara Roman May 13, 1955 [id number]   History:    57 y.o.  for annual gyn exam when no complaints today. Patient has done well and asked about 1 mg by mouth daily for her menopausal symptoms. She had a vaginal hysterectomy in 1999 for dysmenorrhea and fibroids. The patient also has a history in 1992 for cervical LEEP conization for dysplasia and subsequent Pap smears have been normal. She had a maternal aunt who had breast cancer in her 78s. At that time she was not checked for BRCA1 or BRCA2. She is still alive and lives and [place]. Patient stated her colonoscopy in 5 years ago was normal. Her last mammogram was in November 2013 reported to be normal but dense patient declined flu vaccine. Last bone density study was normal in 2009. Her PCP is Dr. Sharlot Gowda.    Past medical history,surgical history, family history and social history were all reviewed and documented in the EPIC chart.  Gynecologic History Patient's last menstrual period was 07/29/1997. Contraception: status post hysterectomy Last Pap: 2012. Results were: normal Last mammogram: 2013. Results were: normal but dense  Obstetric History OB History  Gravida Para Term Preterm AB SAB TAB Ectopic Multiple Living  1 1 1       1     # Outcome Date GA Lbr Len/2nd Weight Sex Delivery Anes PTL Lv  1 TRM                ROS: A ROS was performed and pertinent positives and negatives are included in the history.  GENERAL: No fevers or chills. HEENT: No change in vision, no earache, sore throat or sinus congestion. NECK: No pain or stiffness.nodular area at the base of her right eardrum near her mandible CARDIOVASCULAR: No chest pain or pressure. No palpitations. PULMONARY: No shortness of breath, cough or wheeze. GASTROINTESTINAL: No abdominal pain, nausea, vomiting or diarrhea, melena or bright red blood per rectum. GENITOURINARY: No urinary frequency, urgency, hesitancy or dysuria. MUSCULOSKELETAL: No joint or muscle pain, no back  pain, no recent trauma. DERMATOLOGIC: No rash, no itching, no lesions. ENDOCRINE: No polyuria, polydipsia, no heat or cold intolerance. No recent change in weight. HEMATOLOGICAL: No anemia or easy bruising or bleeding. NEUROLOGIC: No headache, seizures, numbness, tingling or weakness. PSYCHIATRIC: No depression, no loss of interest in normal activity or change in sleep pattern.     Exam: chaperone present  BP 112/70  Ht 5' 4.25" (1.632 m)  Wt 127 lb (57.607 kg)  BMI 21.63 kg/m2  LMP 07/29/1997  Body mass index is 21.63 kg/(m^2).  General appearance : Well developed well nourished female. No acute distress HEENT: Neck supple, trachea midline, no carotid bruits, no thyroidmegaly, nodular area at the base of her right ear near her mandibleLungs: Clear to auscultation, no rhonchi or wheezes, or rib retractions  Heart: Regular rate and rhythm, no murmurs or gallops Breast:Examined in sitting and supine position were symmetrical in appearance, no palpable masses or tenderness,  no skin retraction, no nipple inversion, no nipple discharge, no skin discoloration, no axillary or supraclavicular lymphadenopathy Abdomen: no palpable masses or tenderness, no rebound or guarding Extremities: no edema or skin discoloration or tenderness  Pelvic:  Bartholin, Urethra, Skene Glands: Within normal limits             Vagina: No gross lesions or discharge  Cervix: absent  Uterus Absent  Adnexa  Without masses or tenderness  Anus and perineum  normal   Rectovaginal  normal sphincter tone without palpated  masses or tenderness             Hemoccult card provided     Assessment/Plan:  57 y.o. female for annual exam who 22 years ago had LEEP cervical conization for dysplasia. I do not have the pathology report as to the degree of dysplasia but through the years her Pap smears have been normal. We discussed a new screening guidelines. Pap smear not done today. Patient refused flu vaccine. Literature and  information on Tdap vaccine provided. The patient to schedule her bone density study. Patient next month will need a mammogram and I have recommended that she order a 3-D because of her dense breasts. She was reminded to submit to the office in medical car for testing. We discussed importance of monthly breast exam. The following labs were ordered: CBC, screen cholesterol, hemoglobin A1c, TSH, comprehensive metabolic panel and urinalysis. Patient will be referred to the ENT as a result of this nodular area that she noted at the base of her right ear near her right mandible and I concurred and we will await results of consultation.   Ok Edwards MD, 4:58 PM 11/12/2012

## 2012-11-13 LAB — URINALYSIS W MICROSCOPIC + REFLEX CULTURE
Bacteria, UA: NONE SEEN
Casts: NONE SEEN
Crystals: NONE SEEN
Glucose, UA: NEGATIVE mg/dL
Hgb urine dipstick: NEGATIVE
Ketones, ur: NEGATIVE mg/dL
Nitrite: NEGATIVE
Specific Gravity, Urine: 1.006 (ref 1.005–1.030)
Squamous Epithelial / LPF: NONE SEEN
pH: 6.5 (ref 5.0–8.0)

## 2012-11-13 LAB — HEPATITIS C ANTIBODY: HCV Ab: NEGATIVE

## 2012-11-15 ENCOUNTER — Other Ambulatory Visit: Payer: Self-pay | Admitting: Gynecology

## 2012-11-15 DIAGNOSIS — R739 Hyperglycemia, unspecified: Secondary | ICD-10-CM

## 2012-11-15 DIAGNOSIS — E7800 Pure hypercholesterolemia, unspecified: Secondary | ICD-10-CM

## 2012-12-07 ENCOUNTER — Other Ambulatory Visit: Payer: Self-pay | Admitting: Family Medicine

## 2012-12-07 DIAGNOSIS — Z1231 Encounter for screening mammogram for malignant neoplasm of breast: Secondary | ICD-10-CM

## 2012-12-17 ENCOUNTER — Other Ambulatory Visit: Payer: BC Managed Care – PPO

## 2012-12-17 ENCOUNTER — Other Ambulatory Visit: Payer: Self-pay | Admitting: *Deleted

## 2012-12-17 DIAGNOSIS — E7800 Pure hypercholesterolemia, unspecified: Secondary | ICD-10-CM

## 2012-12-17 DIAGNOSIS — R7309 Other abnormal glucose: Secondary | ICD-10-CM

## 2013-01-06 ENCOUNTER — Other Ambulatory Visit: Payer: Self-pay | Admitting: Gynecology

## 2013-01-06 ENCOUNTER — Ambulatory Visit (INDEPENDENT_AMBULATORY_CARE_PROVIDER_SITE_OTHER): Payer: BC Managed Care – PPO

## 2013-01-06 ENCOUNTER — Other Ambulatory Visit: Payer: BC Managed Care – PPO

## 2013-01-06 DIAGNOSIS — E7800 Pure hypercholesterolemia, unspecified: Secondary | ICD-10-CM

## 2013-01-06 DIAGNOSIS — Z79890 Hormone replacement therapy (postmenopausal): Secondary | ICD-10-CM

## 2013-01-06 DIAGNOSIS — Z13820 Encounter for screening for osteoporosis: Secondary | ICD-10-CM

## 2013-01-06 DIAGNOSIS — R7309 Other abnormal glucose: Secondary | ICD-10-CM

## 2013-01-06 LAB — LIPID PANEL
Cholesterol: 214 mg/dL — ABNORMAL HIGH (ref 0–200)
Total CHOL/HDL Ratio: 3.7 Ratio
VLDL: 12 mg/dL (ref 0–40)

## 2013-01-06 LAB — GLUCOSE, RANDOM: Glucose, Bld: 80 mg/dL (ref 70–99)

## 2013-01-07 ENCOUNTER — Other Ambulatory Visit: Payer: Self-pay | Admitting: Gynecology

## 2013-01-07 DIAGNOSIS — E7800 Pure hypercholesterolemia, unspecified: Secondary | ICD-10-CM

## 2013-01-18 ENCOUNTER — Ambulatory Visit (HOSPITAL_COMMUNITY): Payer: BC Managed Care – PPO

## 2013-02-03 ENCOUNTER — Ambulatory Visit (HOSPITAL_COMMUNITY): Payer: BC Managed Care – PPO

## 2013-02-07 ENCOUNTER — Ambulatory Visit (HOSPITAL_COMMUNITY): Payer: BC Managed Care – PPO

## 2013-02-08 ENCOUNTER — Ambulatory Visit (HOSPITAL_COMMUNITY)
Admission: RE | Admit: 2013-02-08 | Discharge: 2013-02-08 | Disposition: A | Discharge status: Home / Self Care | Payer: BC Managed Care – PPO | Source: Ambulatory Visit | Attending: Family Medicine | Admitting: Family Medicine

## 2013-02-08 DIAGNOSIS — Z1231 Encounter for screening mammogram for malignant neoplasm of breast: Secondary | ICD-10-CM | POA: Insufficient documentation

## 2013-03-07 ENCOUNTER — Ambulatory Visit (INDEPENDENT_AMBULATORY_CARE_PROVIDER_SITE_OTHER): Payer: BC Managed Care – PPO | Admitting: Family Medicine

## 2013-03-07 VITALS — BP 112/70 | HR 89 | Wt 125.0 lb

## 2013-03-07 DIAGNOSIS — M7710 Lateral epicondylitis, unspecified elbow: Secondary | ICD-10-CM

## 2013-03-07 DIAGNOSIS — M7711 Lateral epicondylitis, right elbow: Secondary | ICD-10-CM

## 2013-03-07 NOTE — Progress Notes (Signed)
   Subjective:    Patient ID: Barbara Roman, female    DOB: [birth year], 57 y.o.   MRN: [id number]  HPI She complains of a one-month history of difficulty with right elbow pain. She notes increased pain with certain physical activities. She has no history of injury or overuse.  Review of Systems     Objective:   Physical Exam Tender to palpation over the right lateral epicondyle. Provocative testing causing increased pain. Good motion of the elbow with no effusion.       Assessment & Plan:  Lateral epicondylitis of right elbow  recommend conservative care with heat, anti-inflammatory, do as many things pounds up and open. She will return here if continued difficulty.

## 2013-03-07 NOTE — Patient Instructions (Signed)
Do as many things as you can palms up and open. You can also use heat to your area for 20-30 minutes 3 times per day and take your favorite anti-inflammatory

## 2013-06-30 ENCOUNTER — Other Ambulatory Visit: Payer: BC Managed Care – PPO

## 2013-07-01 ENCOUNTER — Other Ambulatory Visit: Payer: BC Managed Care – PPO

## 2013-07-01 DIAGNOSIS — E7800 Pure hypercholesterolemia, unspecified: Secondary | ICD-10-CM

## 2013-07-01 LAB — LIPID PANEL
CHOLESTEROL: 214 mg/dL — AB (ref 0–200)
HDL: 68 mg/dL (ref >39)
LDL CALC: 136 mg/dL — AB (ref 0–99)
Total CHOL/HDL Ratio: 3.1 Ratio
Triglycerides: 50 mg/dL (ref <150)
VLDL: 10 mg/dL (ref 0–40)

## 2013-07-26 ENCOUNTER — Ambulatory Visit (INDEPENDENT_AMBULATORY_CARE_PROVIDER_SITE_OTHER): Payer: BC Managed Care – PPO | Admitting: Family Medicine

## 2013-07-26 ENCOUNTER — Encounter: Payer: Self-pay | Admitting: Family Medicine

## 2013-07-26 VITALS — BP 100/70 | HR 80 | Wt 127.0 lb

## 2013-07-26 DIAGNOSIS — S29011A Strain of muscle and tendon of front wall of thorax, initial encounter: Secondary | ICD-10-CM

## 2013-07-26 DIAGNOSIS — IMO0002 Reserved for concepts with insufficient information to code with codable children: Secondary | ICD-10-CM

## 2013-07-26 NOTE — Progress Notes (Signed)
   Subjective:    Patient ID: Barbara Roman, female    DOB: Dec 20, 1955, 58 y.o.   MRN: [id number]  HPI She notes that for the last week when she lies on the left side she will feel her heart beating. She does not feel is in any other position. No chest pain, PND, DOE. She has noted some right-sided chest pain but that's usually when she lifts her husband. She also has a lesion in her left fifth finger.   Review of Systems     Objective:   Physical Exam Cardiac exam shows regular rhythm without murmurs gallops. Lungs are clear to auscultation. No chest wall tenderness. Exam of her left fifth finger shows no palpable lesions.       Assessment & Plan:  Chest wall muscle strain  I explained that the chest wall symptoms are probably strain related. Explained the fact that her feeling her heartbeat while laying on that side is not unusual or indicative any major cardiac issue. No particular therapy needed for the finger.

## 2013-11-04 ENCOUNTER — Other Ambulatory Visit: Payer: Self-pay | Admitting: Gynecology

## 2013-11-15 ENCOUNTER — Other Ambulatory Visit (HOSPITAL_COMMUNITY)
Admission: RE | Admit: 2013-11-15 | Discharge: 2013-11-15 | Disposition: A | Discharge status: Home / Self Care | Payer: BC Managed Care – PPO | Source: Ambulatory Visit | Attending: Gynecology | Admitting: Gynecology

## 2013-11-15 ENCOUNTER — Encounter: Payer: Self-pay | Admitting: Gynecology

## 2013-11-15 ENCOUNTER — Ambulatory Visit (INDEPENDENT_AMBULATORY_CARE_PROVIDER_SITE_OTHER): Payer: BC Managed Care – PPO | Admitting: Gynecology

## 2013-11-15 VITALS — BP 120/76 | Ht 62.5 in | Wt 127.0 lb

## 2013-11-15 DIAGNOSIS — N951 Menopausal and female climacteric states: Secondary | ICD-10-CM

## 2013-11-15 DIAGNOSIS — Z780 Asymptomatic menopausal state: Secondary | ICD-10-CM

## 2013-11-15 DIAGNOSIS — Z79890 Hormone replacement therapy (postmenopausal): Secondary | ICD-10-CM

## 2013-11-15 DIAGNOSIS — Z01419 Encounter for gynecological examination (general) (routine) without abnormal findings: Secondary | ICD-10-CM

## 2013-11-15 LAB — LIPID PANEL
Cholesterol: 204 mg/dL — ABNORMAL HIGH (ref 0–200)
HDL: 63 mg/dL (ref >39)
LDL CALC: 126 mg/dL — AB (ref 0–99)
Total CHOL/HDL Ratio: 3.2 Ratio
Triglycerides: 73 mg/dL (ref <150)
VLDL: 15 mg/dL (ref 0–40)

## 2013-11-15 LAB — CBC WITH DIFFERENTIAL/PLATELET
BASOS PCT: 1 % (ref 0–1)
Basophils Absolute: 0 10*3/uL (ref 0.0–0.1)
EOS ABS: 0.1 10*3/uL (ref 0.0–0.7)
Eosinophils Relative: 2 % (ref 0–5)
HCT: 39.9 % (ref 36.0–46.0)
Hemoglobin: 13.3 g/dL (ref 12.0–15.0)
LYMPHS ABS: 0.8 10*3/uL (ref 0.7–4.0)
Lymphocytes Relative: 22 % (ref 12–46)
MCH: 24.8 pg — ABNORMAL LOW (ref 26.0–34.0)
MCHC: 33.3 g/dL (ref 30.0–36.0)
MCV: 74.4 fL — ABNORMAL LOW (ref 78.0–100.0)
Monocytes Absolute: 0.5 10*3/uL (ref 0.1–1.0)
Monocytes Relative: 13 % — ABNORMAL HIGH (ref 3–12)
NEUTROS PCT: 62 % (ref 43–77)
Neutro Abs: 2.3 10*3/uL (ref 1.7–7.7)
PLATELETS: 262 10*3/uL (ref 150–400)
RBC: 5.36 MIL/uL — AB (ref 3.87–5.11)
RDW: 14.9 % (ref 11.5–15.5)
WBC: 3.7 10*3/uL — ABNORMAL LOW (ref 4.0–10.5)

## 2013-11-15 LAB — COMPREHENSIVE METABOLIC PANEL
ALT: 14 U/L (ref 0–35)
AST: 24 U/L (ref 0–37)
Albumin: 4.1 g/dL (ref 3.5–5.2)
Alkaline Phosphatase: 83 U/L (ref 39–117)
BILIRUBIN TOTAL: 0.6 mg/dL (ref 0.2–1.2)
BUN: 12 mg/dL (ref 6–23)
CO2: 19 mEq/L (ref 19–32)
Calcium: 9.7 mg/dL (ref 8.4–10.5)
Chloride: 102 mEq/L (ref 96–112)
Creat: 0.91 mg/dL (ref 0.50–1.10)
Glucose, Bld: 78 mg/dL (ref 70–99)
Potassium: 4.2 mEq/L (ref 3.5–5.3)
SODIUM: 137 meq/L (ref 135–145)
TOTAL PROTEIN: 7.7 g/dL (ref 6.0–8.3)

## 2013-11-15 LAB — TSH: TSH: 3.261 u[IU]/mL (ref 0.350–4.500)

## 2013-11-15 NOTE — Progress Notes (Signed)
Barbara Roman 1955/09/30 [id number]   History:    58 y.o.  for annual gyn exam with no complaints today. Patient is currently on Estrace 1 mg by mouth daily.she has been on it for more than 10 years.She had a vaginal hysterectomy in 1999 for dysmenorrhea and fibroids. The patient also has a history in 1992 for cervical LEEP conization for dysplasia and subsequent Pap smears have been normal. She had a maternal aunt who had breast cancer in her 55s. At that time she was not checked for BRCA1 or BRCA2. She is still alive and lives and [place]. Patient had a normal colonoscopy in 2009. Patient with no previous history of abnormal Pap smears prior to her hysterectomy. She had a normal bone density study in 2014. She has not had her Tdap vaccine. Patient not interested in flu vaccine. She was tested for hepatitis C and was negative in 2014.Her PCP is Dr. Jill Alexanders.    Past medical history,surgical history, family history and social history were all reviewed and documented in the EPIC chart.  Gynecologic History Patient's last menstrual period was 07/29/1997. Contraception: status post hysterectomy Last Pap: 2012. Results were: normal Last mammogram: 2014. Results were: normal  Obstetric History OB History  Gravida Para Term Preterm AB SAB TAB Ectopic Multiple Living  1 1 1       1     # Outcome Date GA Lbr Len/2nd Weight Sex Delivery Anes PTL Lv  1 TRM                ROS: A ROS was performed and pertinent positives and negatives are included in the history.  GENERAL: No fevers or chills. HEENT: No change in vision, no earache, sore throat or sinus congestion. NECK: No pain or stiffness. CARDIOVASCULAR: No chest pain or pressure. No palpitations. PULMONARY: No shortness of breath, cough or wheeze. GASTROINTESTINAL: No abdominal pain, nausea, vomiting or diarrhea, melena or bright red blood per rectum. GENITOURINARY: No urinary frequency, urgency, hesitancy or dysuria.  MUSCULOSKELETAL: No joint or muscle pain, no back pain, no recent trauma. DERMATOLOGIC: No rash, no itching, no lesions. ENDOCRINE: No polyuria, polydipsia, no heat or cold intolerance. No recent change in weight. HEMATOLOGICAL: No anemia or easy bruising or bleeding. NEUROLOGIC: No headache, seizures, numbness, tingling or weakness. PSYCHIATRIC: No depression, no loss of interest in normal activity or change in sleep pattern.     Exam: chaperone present  BP 120/76  Ht 5' 2.5" (1.588 m)  Wt 127 lb (57.607 kg)  BMI 22.84 kg/m2  LMP 07/29/1997  Body mass index is 22.84 kg/(m^2).  General appearance : Well developed well nourished female. No acute distress HEENT: Neck supple, trachea midline, no carotid bruits, no thyroidmegaly Lungs: Clear to auscultation, no rhonchi or wheezes, or rib retractions  Heart: Regular rate and rhythm, no murmurs or gallops Breast:Examined in sitting and supine position were symmetrical in appearance, no palpable masses or tenderness,  no skin retraction, no nipple inversion, no nipple discharge, no skin discoloration, no axillary or supraclavicular lymphadenopathy Abdomen: no palpable masses or tenderness, no rebound or guarding Extremities: no edema or skin discoloration or tenderness  Pelvic:  Bartholin, Urethra, Skene Glands: Within normal limits             Vagina: No gross lesions or discharge  Cervix: absent  Uterus  Absent  Adnexa  Without masses or tenderness  Anus and perineum  normal   Rectovaginal  normal sphincter tone without palpated masses or  tenderness             Hemoccult Cards were provided     Assessment/Plan:  58 y.o. female for annual exam who needs to begin to taper down her estrogen replacement therapy. We discussed the women's health initiative study. She will begin taking half a tablet of the one milligram tablet daily to eventually next year come off altogether. The following labs were ordered today: CBC, fasting lipid profile,  comprehensive metabolic panel, TSH, and urinalysis. Pap smear was done today. She was reminded of the importance of calcium and vitamin D in regular exercise for osteoporosis prevention. She was provided with literature information on Tdap as well as on the flu vaccine which she has declined today. She will need a bone density study next year. Her mammogram is due in November and she will order a 3-dimensional due to the fact that she has dense breast.  Note: This dictation was prepared with  Dragon/digital dictation along Games developer. Any transcriptional errors that result from this process are unintentional.   Terrance Mass MD, 10:54 AM 11/15/2013

## 2013-11-15 NOTE — Patient Instructions (Signed)

## 2013-11-15 NOTE — Addendum Note (Signed)
Addended by: Thurnell Garbe A on: 11/15/2013 11:15 AM   Modules accepted: Orders

## 2013-11-16 ENCOUNTER — Other Ambulatory Visit: Payer: Self-pay | Admitting: Gynecology

## 2013-11-16 DIAGNOSIS — R718 Other abnormality of red blood cells: Secondary | ICD-10-CM

## 2013-11-16 LAB — URINALYSIS W MICROSCOPIC + REFLEX CULTURE
Bacteria, UA: NONE SEEN
Bilirubin Urine: NEGATIVE
Casts: NONE SEEN
Crystals: NONE SEEN
Glucose, UA: NEGATIVE mg/dL
Hgb urine dipstick: NEGATIVE
Ketones, ur: NEGATIVE mg/dL
LEUKOCYTES UA: NEGATIVE
Nitrite: NEGATIVE
PROTEIN: NEGATIVE mg/dL
SQUAMOUS EPITHELIAL / LPF: NONE SEEN
Specific Gravity, Urine: 1.01 (ref 1.005–1.030)
UROBILINOGEN UA: 0.2 mg/dL (ref 0.0–1.0)
pH: 7 (ref 5.0–8.0)

## 2013-11-16 LAB — CYTOLOGY - PAP

## 2013-12-16 ENCOUNTER — Other Ambulatory Visit: Payer: BC Managed Care – PPO

## 2013-12-16 DIAGNOSIS — R718 Other abnormality of red blood cells: Secondary | ICD-10-CM

## 2013-12-16 LAB — CBC WITH DIFFERENTIAL/PLATELET
Basophils Absolute: 0 10*3/uL (ref 0.0–0.1)
Basophils Relative: 1 % (ref 0–1)
EOS PCT: 3 % (ref 0–5)
Eosinophils Absolute: 0.1 10*3/uL (ref 0.0–0.7)
HEMATOCRIT: 38.9 % (ref 36.0–46.0)
Hemoglobin: 13.1 g/dL (ref 12.0–15.0)
LYMPHS ABS: 0.9 10*3/uL (ref 0.7–4.0)
LYMPHS PCT: 25 % (ref 12–46)
MCH: 24.8 pg — ABNORMAL LOW (ref 26.0–34.0)
MCHC: 33.7 g/dL (ref 30.0–36.0)
MCV: 73.7 fL — ABNORMAL LOW (ref 78.0–100.0)
Monocytes Absolute: 0.6 10*3/uL (ref 0.1–1.0)
Monocytes Relative: 16 % — ABNORMAL HIGH (ref 3–12)
Neutro Abs: 1.9 10*3/uL (ref 1.7–7.7)
Neutrophils Relative %: 55 % (ref 43–77)
PLATELETS: 277 10*3/uL (ref 150–400)
RBC: 5.28 MIL/uL — AB (ref 3.87–5.11)
RDW: 14.2 % (ref 11.5–15.5)
WBC: 3.5 10*3/uL — AB (ref 4.0–10.5)

## 2013-12-19 ENCOUNTER — Encounter: Payer: Self-pay | Admitting: Gynecology

## 2013-12-20 ENCOUNTER — Ambulatory Visit (INDEPENDENT_AMBULATORY_CARE_PROVIDER_SITE_OTHER): Payer: BC Managed Care – PPO | Admitting: Family Medicine

## 2013-12-20 ENCOUNTER — Ambulatory Visit: Payer: BC Managed Care – PPO | Admitting: Family Medicine

## 2013-12-20 ENCOUNTER — Encounter: Payer: Self-pay | Admitting: Family Medicine

## 2013-12-20 VITALS — BP 110/70 | HR 63

## 2013-12-20 DIAGNOSIS — IMO0001 Reserved for inherently not codable concepts without codable children: Secondary | ICD-10-CM

## 2013-12-20 DIAGNOSIS — L03011 Cellulitis of right finger: Secondary | ICD-10-CM

## 2013-12-20 NOTE — Progress Notes (Signed)
   Subjective:    Patient ID: Barbara Roman, female    DOB: 07-19-1955, 58 y.o.   MRN: [id number]  HPI She noticed some pain and swelling as well as slight discomfort to the right second finger. This occurred after she did some maintenance on her  cuticle   Review of Systems     Objective:   Physical Exam The cuticle is red, swollen and evidence of abscess is present.       Assessment & Plan:  Paronychia of second finger of right hand the area was frozen with ethyl chloride and an I+D was performed with a small amount of material expressed. The wound was covered with a Band-Aid.

## 2014-01-09 ENCOUNTER — Other Ambulatory Visit: Payer: Self-pay

## 2014-01-09 ENCOUNTER — Ambulatory Visit (INDEPENDENT_AMBULATORY_CARE_PROVIDER_SITE_OTHER): Payer: BC Managed Care – PPO | Admitting: Family Medicine

## 2014-01-09 DIAGNOSIS — D709 Neutropenia, unspecified: Secondary | ICD-10-CM

## 2014-01-09 NOTE — Progress Notes (Signed)
   Subjective:    Patient ID: Barbara Roman, female    DOB: 1955/09/30, 58 y.o.   MRN: [id number]  HPI she is here for consult concerning recent blood work which did show a slightly low white blood count as well as a low MCV. She was instructed to take iron however she has not been taking this regularly due to constipation ;presently she is using MiraLAX.  Review of Systems     Objective:   Physical Exam Alert and in no distress otherwise not examined. Review of her blood work did show a slightly low white blood count with indices that were normal except for elevated monocytes. Her MCV was low.       Assessment & Plan:  Neutropenia  I explained she has nothing to worry about with a white blood count at that level. There was no indication that there was an underlying pathologic cause for this. I explained that her low iron is probably nutritional in nature. I will call in an iron supplement and recommend multivitamin with iron. Recheck this in one month.

## 2014-02-15 ENCOUNTER — Other Ambulatory Visit: Payer: Self-pay | Admitting: Gynecology

## 2014-02-15 DIAGNOSIS — Z1231 Encounter for screening mammogram for malignant neoplasm of breast: Secondary | ICD-10-CM

## 2014-03-02 ENCOUNTER — Ambulatory Visit (HOSPITAL_COMMUNITY)
Admission: RE | Admit: 2014-03-02 | Discharge: 2014-03-02 | Disposition: A | Discharge status: Home / Self Care | Payer: BC Managed Care – PPO | Source: Ambulatory Visit | Attending: Gynecology | Admitting: Gynecology

## 2014-03-02 DIAGNOSIS — Z1231 Encounter for screening mammogram for malignant neoplasm of breast: Secondary | ICD-10-CM | POA: Insufficient documentation

## 2014-03-17 ENCOUNTER — Other Ambulatory Visit: Payer: Self-pay | Admitting: Gynecology

## 2014-03-30 ENCOUNTER — Telehealth: Payer: Self-pay | Admitting: *Deleted

## 2014-03-30 ENCOUNTER — Telehealth: Payer: Self-pay

## 2014-03-30 NOTE — Telephone Encounter (Signed)
Tell her that the new guidelines recommend 3-D mammogram in women with dense breast. I saw her last report and they described dense breast. Women with dense breast have a slightly higher risk of breast cancer than those without dense breast. The 3-D improves the sensitivity and detection of small lesions that could be missed with routime mammography

## 2014-03-30 NOTE — Telephone Encounter (Signed)
Patient had a 3D Screening Mammo this year and [employer] has denied covering it and indicated it was not medically necessary.  Patient asked if you had any supporting information that she might offer to [facility] as to why she needed 3D in hopes to get them to cover it.

## 2014-03-30 NOTE — Telephone Encounter (Signed)
Pt had 3D mammogram done this year states [employer] sent a letter stating they are not going to pay for exam. I told pt she should call [employer] ask them about this. Pt agreed and will follow up with [employer].

## 2014-03-31 NOTE — Telephone Encounter (Signed)
I read this to patient and told her I will print it and send it to her.  Mailed.

## 2014-07-18 ENCOUNTER — Encounter: Payer: Self-pay | Admitting: Gastroenterology

## 2014-10-05 ENCOUNTER — Ambulatory Visit (INDEPENDENT_AMBULATORY_CARE_PROVIDER_SITE_OTHER): Payer: BC Managed Care – PPO | Admitting: Family Medicine

## 2014-10-05 ENCOUNTER — Encounter: Payer: Self-pay | Admitting: Family Medicine

## 2014-10-05 VITALS — BP 118/74 | HR 80 | Ht 63.0 in | Wt 129.0 lb

## 2014-10-05 DIAGNOSIS — Z634 Disappearance and death of family member: Secondary | ICD-10-CM | POA: Diagnosis not present

## 2014-10-05 DIAGNOSIS — Z0000 Encounter for general adult medical examination without abnormal findings: Secondary | ICD-10-CM

## 2014-10-05 DIAGNOSIS — Z23 Encounter for immunization: Secondary | ICD-10-CM

## 2014-10-05 DIAGNOSIS — Z833 Family history of diabetes mellitus: Secondary | ICD-10-CM | POA: Diagnosis not present

## 2014-10-05 NOTE — Progress Notes (Signed)
   Subjective:    Patient ID: Barbara Roman, female    DOB: Jul 13, 1955, 59 y.o.   MRN: [id number]  HPI  She is here for a complete examination. Her husband died recently. She had a very long course downhill and she was his primary caregiver. She seems to be handling this death fairly well. She has been to hospice on one occasion.She is now postmenopausal. 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 There is a family history of diabetes. She continues to work. She and her son are getting along well. She has a trip planned to [place] with a friend of hers. Family and social history as well as health maintenance and immunizations were reviewed. She has had no difficulty with abdominal pain, nausea, vomiting, chest pain.   Review of Systems  All other systems reviewed and are negative.      Objective:   Physical Exam  BP 118/74 mmHg  Pulse 80  Ht 5\' 3"  (1.6 m)  Wt 129 lb (58.514 kg)  BMI 22.86 kg/m2  SpO2 99%  LMP 07/29/1997  General Appearance:    Alert, cooperative, no distress, appears stated age  Head:    Normocephalic, without obvious abnormality, atraumatic  Eyes:    PERRL, conjunctiva/corneas clear, EOM's intact, fundi    benign  Ears:    Normal TM's and external ear canals  Nose:   Nares normal, mucosa normal, no drainage or sinus   tenderness  Throat:   Lips, mucosa, and tongue normal; teeth and gums normal  Neck:   Supple, no lymphadenopathy;  thyroid:  no   enlargement/tenderness/nodules; no carotid   bruit or JVD  Back:    Spine nontender, no curvature, ROM normal, no CVA     tenderness  Lungs:     Clear to auscultation bilaterally without wheezes, rales or     ronchi; respirations unlabored  Chest Wall:    No tenderness or deformity   Heart:    Regular rate and rhythm, S1 and S2 normal, no murmur, rub   or gallop  Breast Exam:    Deferred to GYN  Abdomen:     Soft, non-tender, nondistended, normoactive bowel sounds,    no masses, no  hepatosplenomegaly  Genitalia:    Deferred to GYN     Extremities:   No clubbing, cyanosis or edema  Pulses:   2+ and symmetric all extremities  Skin:   Skin color, texture, turgor normal, no rashes or lesions  Lymph nodes:   Cervical, supraclavicular, and axillary nodes normal  Neurologic:   CNII-XII intact, normal strength, sensation and gait; reflexes 2+ and symmetric throughout          Psych:   Normal mood, affect, hygiene and grooming.          Assessment & Plan:  Routine general medical examination at a health care facility - Plan: CBC with Differential/Platelet, Comprehensive metabolic panel, Lipid panel  Need for prophylactic vaccination with combined diphtheria-tetanus-pertussis (DTP) vaccine - Plan: Tdap vaccine greater than or equal to 7yo IM  Family history of diabetes mellitus  Death of husband Routine blood screening will be ordered. Also discussed the death of her husband and the benefit of bereavement. She has been once and seems to be handling this well. I encouraged her to deal with this openly and honestly and if she needs more help certainly not hesitate to call me or go back to hospice.

## 2014-10-06 LAB — COMPREHENSIVE METABOLIC PANEL
ALT: 18 U/L (ref 6–29)
AST: 20 U/L (ref 10–35)
Albumin: 4.2 g/dL (ref 3.6–5.1)
Alkaline Phosphatase: 93 U/L (ref 33–130)
BILIRUBIN TOTAL: 0.6 mg/dL (ref 0.2–1.2)
BUN: 12 mg/dL (ref 7–25)
CHLORIDE: 105 mmol/L (ref 98–110)
CO2: 22 mmol/L (ref 20–31)
Calcium: 9.9 mg/dL (ref 8.6–10.4)
Creat: 0.83 mg/dL (ref 0.50–1.05)
GLUCOSE: 77 mg/dL (ref 65–99)
Potassium: 4 mmol/L (ref 3.5–5.3)
SODIUM: 138 mmol/L (ref 135–146)
Total Protein: 7.3 g/dL (ref 6.1–8.1)

## 2014-10-06 LAB — CBC WITH DIFFERENTIAL/PLATELET
BASOS PCT: 0 % (ref 0–1)
Basophils Absolute: 0 10*3/uL (ref 0.0–0.1)
EOS PCT: 2 % (ref 0–5)
Eosinophils Absolute: 0.1 10*3/uL (ref 0.0–0.7)
HCT: 41.2 % (ref 36.0–46.0)
Hemoglobin: 13.4 g/dL (ref 12.0–15.0)
Lymphocytes Relative: 26 % (ref 12–46)
Lymphs Abs: 1.2 10*3/uL (ref 0.7–4.0)
MCH: 24.9 pg — AB (ref 26.0–34.0)
MCHC: 32.5 g/dL (ref 30.0–36.0)
MCV: 76.6 fL — ABNORMAL LOW (ref 78.0–100.0)
MONO ABS: 0.6 10*3/uL (ref 0.1–1.0)
MONOS PCT: 12 % (ref 3–12)
MPV: 9 fL (ref 8.6–12.4)
Neutro Abs: 2.8 10*3/uL (ref 1.7–7.7)
Neutrophils Relative %: 60 % (ref 43–77)
Platelets: 255 10*3/uL (ref 150–400)
RBC: 5.38 MIL/uL — ABNORMAL HIGH (ref 3.87–5.11)
RDW: 15 % (ref 11.5–15.5)
WBC: 4.6 10*3/uL (ref 4.0–10.5)

## 2014-10-06 LAB — LIPID PANEL
Cholesterol: 216 mg/dL — ABNORMAL HIGH (ref 125–200)
HDL: 67 mg/dL (ref >=46)
LDL CALC: 139 mg/dL — AB (ref <130)
TRIGLYCERIDES: 52 mg/dL (ref <150)
Total CHOL/HDL Ratio: 3.2 Ratio (ref <=5.0)
VLDL: 10 mg/dL (ref <30)

## 2014-11-17 ENCOUNTER — Encounter: Payer: BC Managed Care – PPO | Admitting: Gynecology

## 2014-12-21 ENCOUNTER — Encounter: Payer: Self-pay | Admitting: Gynecology

## 2014-12-21 ENCOUNTER — Ambulatory Visit (INDEPENDENT_AMBULATORY_CARE_PROVIDER_SITE_OTHER): Payer: BC Managed Care – PPO | Admitting: Gynecology

## 2014-12-21 VITALS — BP 110/70 | Ht 62.75 in | Wt 137.0 lb

## 2014-12-21 DIAGNOSIS — M6289 Other specified disorders of muscle: Secondary | ICD-10-CM

## 2014-12-21 DIAGNOSIS — Z01419 Encounter for gynecological examination (general) (routine) without abnormal findings: Secondary | ICD-10-CM

## 2014-12-21 DIAGNOSIS — Z780 Asymptomatic menopausal state: Secondary | ICD-10-CM

## 2014-12-21 LAB — RHEUMATOID FACTOR: Rhuematoid fact SerPl-aCnc: <10 IU/mL (ref <=14)

## 2014-12-21 NOTE — Patient Instructions (Signed)

## 2014-12-21 NOTE — Progress Notes (Signed)
Barbara Roman 1955/06/30 [id number]   History:    58 y.o.  for annual gyn exam  With complaints of several months of nonspecific generalized body aches and pains. Patient denies any photosensitivity or any rashes on her face. Her PCP is Dr. Redmond School who she had seen back in August we did her basic blood work panel. Patient last year had been counseled that she had been on hormonal replacement therapy for over 10 years and she has tapered off and is no longer taking her estrogen. She is suffering occasionally from vasomotor symptoms. Patient had a vaginal hysterectomy 1999 for dysmenorrhea and fibroids.The patient also has a history in 1992 for cervical LEEP conization for dysplasia and subsequent Pap smears have been normal. She had a maternal aunt who had breast cancer in her 10s. At that time she was not checked for BRCA1 or BRCA2. She is still alive and lives and [place]. Patient had a normal colonoscopy in 2009.She had a normal bone density study in 2014. She has not had her Tdap vaccine. Patient not interested in flu vaccine. She was tested for hepatitis C and was negative in 2014.Her PCP is Dr. Jill Alexanders.  Past medical history,surgical history, family history and social history were all reviewed and documented in the EPIC chart.  Gynecologic History Patient's last menstrual period was 07/29/1997. Contraception: status post hysterectomy Last Pap: 2015. Results were: normal Last mammogram: 2016. Results were: Normal but dense 3-D  Obstetric History OB History  Gravida Para Term Preterm AB SAB TAB Ectopic Multiple Living  1 1 1       1     # Outcome Date GA Lbr Len/2nd Weight Sex Delivery Anes PTL Lv  1 Term                ROS: A ROS was performed and pertinent positives and negatives are included in the history.  GENERAL: No fevers or chills. HEENT: No change in vision, no earache, sore throat or sinus congestion. NECK: No pain or stiffness. CARDIOVASCULAR: No chest pain or  pressure. No palpitations. PULMONARY: No shortness of breath, cough or wheeze. GASTROINTESTINAL: No abdominal pain, nausea, vomiting or diarrhea, melena or bright red blood per rectum. GENITOURINARY: No urinary frequency, urgency, hesitancy or dysuria. MUSCULOSKELETAL: No joint or muscle pain, no back pain, no recent trauma. DERMATOLOGIC: No rash, no itching, no lesions. ENDOCRINE: No polyuria, polydipsia, no heat or cold intolerance. No recent change in weight. HEMATOLOGICAL: No anemia or easy bruising or bleeding. NEUROLOGIC: No headache, seizures, numbness, tingling or weakness. PSYCHIATRIC: No depression, no loss of interest in normal activity or change in sleep pattern.     Exam: chaperone present  BP 110/70 mmHg  Ht 5' 2.75" (1.594 m)  Wt 137 lb (62.143 kg)  BMI 24.46 kg/m2  LMP 07/29/1997  Body mass index is 24.46 kg/(m^2).  General appearance : Well developed well nourished female. No acute distress HEENT: Eyes: no retinal hemorrhage or exudates,  Neck supple, trachea midline, no carotid bruits, no thyroidmegaly Lungs: Clear to auscultation, no rhonchi or wheezes, or rib retractions  Heart: Regular rate and rhythm, no murmurs or gallops Breast:Examined in sitting and supine position were symmetrical in appearance, no palpable masses or tenderness,  no skin retraction, no nipple inversion, no nipple discharge, no skin discoloration, no axillary or supraclavicular lymphadenopathy Abdomen: no palpable masses or tenderness, no rebound or guarding Extremities: no edema or skin discoloration or tenderness  Pelvic:  Bartholin, Urethra, Skene Glands: Within normal  limits             Vagina: No gross lesions or discharge  Cervix: Absent  Uterus  absent  Adnexa  Without masses or tenderness  Anus and perineum  normal   Rectovaginal  normal sphincter tone without palpated masses or tenderness             Hemoccult PCP provides     Assessment/Plan:  59 y.o. female for annual exam who is  husband died about 6 months of a heart attack. Patient is coping well. Patient several months of been complaining of muscle tiredness and fatigue. We are going to do an ANA to rule out collagen vascular disease. We'll also check her rheumatoid factor to rule out arthritis. And will also check a vitamin D level to rule out vitamin D deficiency. I've explained to the patient of these results are normal that she will need to follow-up with her PCP for further testing. To assist with her vasomotor symptoms that she transition off the estrogen I've recommended peppermint oil to apply transdermally to 3 times a day as needed as well as Relizen natural food supplement to help with symptoms as well. She was reminded do her monthly breast exam. She'll need to schedule her mammogram for January 2017. We discussed once again the importance of a three-dimensional mammogram because of her extremely dense breasts as reported on the last mammogram. She will schedule a bone density study here in the office in the next few weeks.   Terrance Mass MD, 4:39 PM 12/21/2014

## 2014-12-22 ENCOUNTER — Encounter: Payer: Self-pay | Admitting: Gynecology

## 2014-12-22 LAB — ANA: Anti Nuclear Antibody(ANA): NEGATIVE

## 2014-12-22 LAB — VITAMIN D 25 HYDROXY (VIT D DEFICIENCY, FRACTURES): Vit D, 25-Hydroxy: 18 ng/mL — ABNORMAL LOW (ref 30–100)

## 2014-12-25 ENCOUNTER — Other Ambulatory Visit: Payer: Self-pay | Admitting: Gynecology

## 2014-12-25 DIAGNOSIS — E559 Vitamin D deficiency, unspecified: Secondary | ICD-10-CM

## 2014-12-25 MED ORDER — VITAMIN D (ERGOCALCIFEROL) 1.25 MG (50000 UNIT) PO CAPS: 50000.0000 [IU] | ORAL_CAPSULE | ORAL | Status: DC

## 2014-12-28 ENCOUNTER — Telehealth: Payer: Self-pay | Admitting: Family Medicine

## 2014-12-28 NOTE — Telephone Encounter (Signed)
Pt informed and verbalized understanding

## 2014-12-28 NOTE — Telephone Encounter (Signed)
Pt called and had her annual exam with [facility], and they checked her vit d and put her on a vit d capsule for 12 days and told her to come back to her pcp she wanted to know if she needed to finish the pills before she comes to follow up with you about her vit d and have it rechecked, they put the orders in for the future vit d check, pt can be reached at [phone]

## 2014-12-28 NOTE — Telephone Encounter (Signed)
The blood work needs to be drawn at the end of the course of treatment

## 2015-02-06 ENCOUNTER — Other Ambulatory Visit: Payer: Self-pay

## 2015-02-06 DIAGNOSIS — Z1231 Encounter for screening mammogram for malignant neoplasm of breast: Secondary | ICD-10-CM

## 2015-03-08 ENCOUNTER — Ambulatory Visit (INDEPENDENT_AMBULATORY_CARE_PROVIDER_SITE_OTHER): Payer: BC Managed Care – PPO | Admitting: Family Medicine

## 2015-03-08 ENCOUNTER — Telehealth: Payer: Self-pay | Admitting: Medical

## 2015-03-08 ENCOUNTER — Encounter: Payer: Self-pay | Admitting: Family Medicine

## 2015-03-08 VITALS — BP 126/70 | HR 68 | Temp 98.2°F | Wt 143.2 lb

## 2015-03-08 DIAGNOSIS — M2550 Pain in unspecified joint: Secondary | ICD-10-CM

## 2015-03-08 DIAGNOSIS — E559 Vitamin D deficiency, unspecified: Secondary | ICD-10-CM

## 2015-03-08 NOTE — Progress Notes (Signed)
   Subjective:    Patient ID: Barbara Roman, female    DOB: Nov 27, 1955, 60 y.o.   MRN: [id number]  HPI She is here for consult concerning continued difficulty with areas aches and pains in her muscles and joints. She points to various parts on her body to where she is having the pain. She was seen recently by her gynecologist. Blood work did show vitamin D deficiency. She has been on 50,000 units weekly for the last 6 weeks. She continues to complain of difficulty because of this.   Review of Systems     Objective:   Physical Exam Alert and in no distress. Exam of her hands which seems to be the big area where she is having concern shows no swelling or palpable tenderness with good motion. Review of her record indicates she did have a low MCV with a fairly normal hemoglobin.       Assessment & Plan:  Arthralgia - Plan: Iron, TIBC and Ferritin Panel  Vitamin D deficiency - Plan: VITAMIN D 25 Hydroxy (Vit-D Deficiency, Fractures) I will check her vitamin D level as well as iron and ferritin. Recommend she take Tylenol for her pain.

## 2015-03-08 NOTE — Telephone Encounter (Addendum)
Error

## 2015-03-09 ENCOUNTER — Telehealth: Payer: Self-pay

## 2015-03-09 LAB — IRON,TIBC AND FERRITIN PANEL
%SAT: 21 % (ref 11–50)
Ferritin: 43 ng/mL (ref 10–291)
Iron: 78 ug/dL (ref 45–160)
TIBC: 364 ug/dL (ref 250–450)

## 2015-03-09 LAB — VITAMIN D 25 HYDROXY (VIT D DEFICIENCY, FRACTURES): Vit D, 25-Hydroxy: 26 ng/mL — ABNORMAL LOW (ref 30–100)

## 2015-03-09 MED ORDER — VITAMIN D (ERGOCALCIFEROL) 1.25 MG (50000 UNIT) PO CAPS: 50000.0000 [IU] | ORAL_CAPSULE | ORAL | Status: DC

## 2015-03-09 NOTE — Telephone Encounter (Signed)
Pt is aware and filled vit d for 8 weeks

## 2015-03-09 NOTE — Telephone Encounter (Signed)
error 

## 2015-03-09 NOTE — Telephone Encounter (Signed)
-----   Message from Denita Lung, MD sent at 03/09/2015  8:15 AM EST ----- The vitamin C level has improved but is still low. Go ahead and call in another 50,000 units weekly of vitamin D for the  8 weeksAnd then have her come back for a recheck. Recommend Tylenol as well as 2 Aleve twice per day for aches and pains.

## 2015-03-15 ENCOUNTER — Ambulatory Visit
Admission: RE | Admit: 2015-03-15 | Discharge: 2015-03-15 | Disposition: A | Discharge status: Home / Self Care | Payer: BC Managed Care – PPO | Source: Ambulatory Visit

## 2015-03-15 ENCOUNTER — Ambulatory Visit
Admission: RE | Admit: 2015-03-15 | Discharge: 2015-03-15 | Disposition: A | Discharge status: Home / Self Care | Payer: BC Managed Care – PPO | Source: Ambulatory Visit | Attending: Gynecology | Admitting: Gynecology

## 2015-03-15 DIAGNOSIS — Z1231 Encounter for screening mammogram for malignant neoplasm of breast: Secondary | ICD-10-CM

## 2015-03-15 DIAGNOSIS — Z780 Asymptomatic menopausal state: Secondary | ICD-10-CM

## 2015-09-05 ENCOUNTER — Telehealth: Payer: Self-pay | Admitting: *Deleted

## 2015-09-05 NOTE — Telephone Encounter (Signed)
Bone density was normal.

## 2015-09-05 NOTE — Telephone Encounter (Signed)
Pt informed with the below note. 

## 2015-09-05 NOTE — Telephone Encounter (Signed)
Pt had dexa done at [facility] center on 03/15/15 would like results. Please advise

## 2015-11-12 ENCOUNTER — Ambulatory Visit (INDEPENDENT_AMBULATORY_CARE_PROVIDER_SITE_OTHER): Payer: BC Managed Care – PPO | Admitting: Family Medicine

## 2015-11-12 ENCOUNTER — Encounter: Payer: Self-pay | Admitting: Family Medicine

## 2015-11-12 VITALS — BP 130/80 | HR 78 | Ht 63.0 in | Wt 148.2 lb

## 2015-11-12 DIAGNOSIS — Z7189 Other specified counseling: Secondary | ICD-10-CM

## 2015-11-12 DIAGNOSIS — M25579 Pain in unspecified ankle and joints of unspecified foot: Secondary | ICD-10-CM | POA: Diagnosis not present

## 2015-11-12 DIAGNOSIS — Z719 Counseling, unspecified: Secondary | ICD-10-CM | POA: Diagnosis not present

## 2015-11-12 NOTE — Progress Notes (Signed)
   Subjective:    Patient ID: Barbara Roman, female    DOB: [birth year], 60 y.o.   MRN: [id number]  HPI She is here for consultation concerning continued difficulty with bilateral foot pain. Her pain is mainly over the tarsal heads. She was seen in the past by podiatry and given an orthotic however she has not been using this. She states that whenever she gets up this is quite painful but as the day progresses it become less painful until she sits down again. She also freely admits that she has not fully gone through debridement concerning her husband who died in 2022-07-08 of last year. She is also now having to take care of her sister who apparently had a CVA and her mother is in ill health as well. She does plan on retiring next year.   Review of Systems     Objective:   Physical Exam Bilateral foot exam shows tenderness palpation over the metatarsal heads and to a lesser extent over the arch. No heel pain. Full motion of the foot and ankle.       Assessment & Plan:  Pain in joint, ankle and foot, unspecified laterality  Bereavement counseling I encouraged her to use the orthotics regularly to see if they would help and if she cannot find it, go back to her podiatrist to get another orthotic. Discussed the fact that this is not going to be something she can use in all of her shoes and she might have to decide what is more important. We then discussed bereavement. I strongly encouraged her to get involved in bereavement and also take her son who apparently is also still having anger issues. Over 25 minutes, greater than 50% spent in counseling and coordination of care.

## 2015-12-24 ENCOUNTER — Encounter: Payer: Self-pay | Admitting: Gynecology

## 2015-12-24 ENCOUNTER — Ambulatory Visit (INDEPENDENT_AMBULATORY_CARE_PROVIDER_SITE_OTHER): Payer: BC Managed Care – PPO | Admitting: Gynecology

## 2015-12-24 VITALS — BP 130/78 | Ht 63.0 in | Wt 148.0 lb

## 2015-12-24 DIAGNOSIS — Z8639 Personal history of other endocrine, nutritional and metabolic disease: Secondary | ICD-10-CM | POA: Diagnosis not present

## 2015-12-24 DIAGNOSIS — Z780 Asymptomatic menopausal state: Secondary | ICD-10-CM

## 2015-12-24 DIAGNOSIS — Z01419 Encounter for gynecological examination (general) (routine) without abnormal findings: Secondary | ICD-10-CM | POA: Diagnosis not present

## 2015-12-24 NOTE — Patient Instructions (Signed)
Patient information: High cholesterol (The Basics)  What is cholesterol? - Cholesterol is a substance that is found in the blood. Everyone has some. It is needed for good health. The problem is, people sometimes have too much cholesterol. Compared with people with normal cholesterol, people with high cholesterol have a higher risk of heart attacks, strokes, and other health problems. The higher your cholesterol, the higher your risk of these problems. Cholesterol levels in your body are determined significantly by your diet. Cholesterol levels may also be related to heart disease. The following material helps to explain this relationship and discusses what you can do to help keep your heart healthy. Not all cholesterol is bad. Low-density lipoprotein (LDL) cholesterol is the "bad" cholesterol. It may cause fatty deposits to build up inside your arteries. High-density lipoprotein (HDL) cholesterol is "good." It helps to remove the "bad" LDL cholesterol from your blood. Cholesterol is a very important risk factor for heart disease. Other risk factors are high blood pressure, smoking, stress, heredity, and weight.  The heart muscle gets its supply of blood through the coronary arteries. If your LDL cholesterol is high and your HDL cholesterol is low, you are at risk for having fatty deposits build up in your coronary arteries. This leaves less room through which blood can flow. Without sufficient blood and oxygen, the heart muscle cannot function properly and you may feel chest pains (angina pectoris). When a coronary artery closes up entirely, a part of the heart muscle may die, causing a heart attack (myocardial infarction).  CHECKING CHOLESTEROL When your caregiver sends your blood to a lab to be analyzed for cholesterol, a complete lipid (fat) profile may be done. With this test, the total amount of cholesterol and levels of LDL and HDL are determined. Triglycerides  are a type of fat that circulates in the blood and can also be used to determine heart disease risk. Are there different types of cholesterol? - Yes, there are a few different types. If you get a cholesterol test, you may hear your doctor or nurse talk about: Total cholesterol  LDL cholesterol - Some people call this the "bad" cholesterol. That's because having high LDL levels raises your risk of heart attacks, strokes, and other health problems.  HDL cholesterol - Some people call this the "good" cholesterol. That's because having high HDL levels lowers your risk of heart attacks, strokes, and other health problems.  Non-HDL cholesterol - Non-HDL cholesterol is your total cholesterol minus your HDL cholesterol.  Triglycerides - Triglycerides are not cholesterol. They are a type of fat. But they often get measured when cholesterol is measured. (Having high triglycerides also seems to increase the risk of heart attacks and strokes.)   Keep in mind, though, that many people who cannot meet these goals still have a low risk of heart attacks and strokes. What should I do if my doctor tells me I have high cholesterol? - Ask your doctor what your overall risk of heart attacks and strokes is. High cholesterol, by itself, is not always a reason to worry. Having high cholesterol is just one of many things that can increase your risk of heart attacks and strokes. Other factors that increase your risk include:  Cigarette smoking  High blood pressure  Having a parent, sister, or brother who got heart disease at a young age (Young, in this case, means younger than 55 for men and younger than 65 for women.)  Being a man (Women are at risk, too, but men   have a higher risk.)  Older age  If you are at high risk of heart attacks and strokes, having high cholesterol is a problem. On the other hand, if you have are at low risk, having high cholesterol may not mean much. Should I take medicine to lower cholesterol? - Not  everyone who has high cholesterol needs medicines. Your doctor or nurse will decide if you need them based on your age, family history, and other health concerns.  You should probably take a cholesterol-lowering medicine called a statin if you: Already had a heart attack or stroke  Have known heart disease  Have diabetes  Have a condition called peripheral artery disease, which makes it painful to walk, and happens when the arteries in your legs get clogged with fatty deposits  Have an abdominal aortic aneurysm, which is a widening of the main artery in the belly  Most people with any of the conditions listed above should take a statin no matter what their cholesterol level is. If your doctor or nurse puts you on a statin, stay on it. The medicine may not make you feel any different. But it can help prevent heart attacks, strokes, and death.  Can I lower my cholesterol without medicines? - Yes, you can lower your cholesterol some by:  Avoiding red meat, butter, fried foods, cheese, and other foods that have a lot of saturated fat  Losing weight (if you are overweight)  Being more active Even if these steps do little to change your cholesterol, they can improve your health in many ways.                                                   Cholesterol Control Diet  CONTROLLING CHOLESTEROL WITH DIET Although exercise and lifestyle factors are important, your diet is key. That is because certain foods are known to raise cholesterol and others to lower it. The goal is to balance foods for their effect on cholesterol and more importantly, to replace saturated and trans fat with other types of fat, such as monounsaturated fat, polyunsaturated fat, and omega-3 fatty acids. On average, a person should consume no more than 15 to 17 g of saturated fat daily. Saturated and trans fats are considered "bad" fats, and they will raise LDL cholesterol. Saturated fats are primarily found in animal products such as  meats, butter, and cream. However, that does not mean you need to sacrifice all your favorite foods. Today, there are good tasting, low-fat, low-cholesterol substitutes for most of the things you like to eat. Choose low-fat or nonfat alternatives. Choose round or loin cuts of red meat, since these types of cuts are lowest in fat and cholesterol. Chicken (without the skin), fish, veal, and ground turkey breast are excellent choices. Eliminate fatty meats, such as hot dogs and salami. Even shellfish have little or no saturated fat. Have a 3 oz (85 g) portion when you eat lean meat, poultry, or fish. Trans fats are also called "partially hydrogenated oils." They are oils that have been scientifically manipulated so that they are solid at room temperature resulting in a longer shelf life and improved taste and texture of foods in which they are added. Trans fats are found in stick margarine, some tub margarines, cookies, crackers, and baked goods.  When baking and cooking, oils are an excellent substitute   for butter. The monounsaturated oils are especially beneficial since it is believed they lower LDL and raise HDL. The oils you should avoid entirely are saturated tropical oils, such as coconut and palm.  Remember to eat liberally from food groups that are naturally free of saturated and trans fat, including fish, fruit, vegetables, beans, grains (barley, rice, couscous, bulgur wheat), and pasta (without cream sauces).  IDENTIFYING FOODS THAT LOWER CHOLESTEROL  Soluble fiber may lower your cholesterol. This type of fiber is found in fruits such as apples, vegetables such as broccoli, potatoes, and carrots, legumes such as beans, peas, and lentils, and grains such as barley. Foods fortified with plant sterols (phytosterol) may also lower cholesterol. You should eat at least 2 g per day of these foods for a cholesterol lowering effect.  Read package labels to identify low-saturated fats, trans fats free, and  low-fat foods at the supermarket. Select cheeses that have only 2 to 3 g saturated fat per ounce. Use a heart-healthy tub margarine that is free of trans fats or partially hydrogenated oil. When buying baked goods (cookies, crackers), avoid partially hydrogenated oils. Breads and muffins should be made from whole grains (whole-wheat or whole oat flour, instead of "flour" or "enriched flour"). Buy non-creamy canned soups with reduced salt and no added fats.  FOOD PREPARATION TECHNIQUES  Never deep-fry. If you must fry, either stir-fry, which uses very little fat, or use non-stick cooking sprays. When possible, broil, bake, or roast meats, and steam vegetables. Instead of dressing vegetables with butter or margarine, use lemon and herbs, applesauce and cinnamon (for squash and sweet potatoes), nonfat yogurt, salsa, and low-fat dressings for salads.  LOW-SATURATED FAT / LOW-FAT FOOD SUBSTITUTES Meats / Saturated Fat (g)  Avoid: Steak, marbled (3 oz/85 g) / 11 g   Choose: Steak, lean (3 oz/85 g) / 4 g   Avoid: Hamburger (3 oz/85 g) / 7 g   Choose: Hamburger, lean (3 oz/85 g) / 5 g   Avoid: Ham (3 oz/85 g) / 6 g   Choose: Ham, lean cut (3 oz/85 g) / 2.4 g   Avoid: Chicken, with skin, dark meat (3 oz/85 g) / 4 g   Choose: Chicken, skin removed, dark meat (3 oz/85 g) / 2 g   Avoid: Chicken, with skin, light meat (3 oz/85 g) / 2.5 g   Choose: Chicken, skin removed, light meat (3 oz/85 g) / 1 g  Dairy / Saturated Fat (g)  Avoid: Whole milk (1 cup) / 5 g   Choose: Low-fat milk, 2% (1 cup) / 3 g   Choose: Low-fat milk, 1% (1 cup) / 1.5 g   Choose: Skim milk (1 cup) / 0.3 g   Avoid: Hard cheese (1 oz/28 g) / 6 g   Choose: Skim milk cheese (1 oz/28 g) / 2 to 3 g   Avoid: Cottage cheese, 4% fat (1 cup) / 6.5 g   Choose: Low-fat cottage cheese, 1% fat (1 cup) / 1.5 g   Avoid: Ice cream (1 cup) / 9 g   Choose: Sherbet (1 cup) / 2.5 g   Choose: Nonfat frozen yogurt (1 cup) / 0.3 g    Choose: Frozen fruit bar / trace   Avoid: Whipped cream (1 tbs) / 3.5 g   Choose: Nondairy whipped topping (1 tbs) / 1 g  Condiments / Saturated Fat (g)  Avoid: Mayonnaise (1 tbs) / 2 g   Choose: Low-fat mayonnaise (1 tbs) / 1 g   Avoid:   Butter (1 tbs) / 7 g   Choose: Extra light margarine (1 tbs) / 1 g   Avoid: Coconut oil (1 tbs) / 11.8 g   Choose: Olive oil (1 tbs) / 1.8 g   Choose: Corn oil (1 tbs) / 1.7 g   Choose: Safflower oil (1 tbs) / 1.2 g   Choose: Sunflower oil (1 tbs) / 1.4 g   Choose: Soybean oil (1 tbs) / 2.4 g   Choose: Canola oil (1 tbs) / 1 g   Exercise to Lose Weight Exercise and a healthy diet may help you lose weight. Your doctor may suggest specific exercises. EXERCISE IDEAS AND TIPS  Choose low-cost things you enjoy doing, such as walking, bicycling, or exercising to workout videos.   Take stairs instead of the elevator.   Walk during your lunch break.   Park your car further away from work or school.   Go to a gym or an exercise class.   Start with 5 to 10 minutes of exercise each day. Build up to 30 minutes of exercise 4 to 6 days a week.   Wear shoes with good support and comfortable clothes.   Stretch before and after working out.   Work out until you breathe harder and your heart beats faster.   Drink extra water when you exercise.   Do not do so much that you hurt yourself, feel dizzy, or get very short of breath.  Exercises that burn about 150 calories:  Running 1  miles in 15 minutes.   Playing volleyball for 45 to 60 minutes.   Washing and waxing a car for 45 to 60 minutes.   Playing touch football for 45 minutes.   Walking 1  miles in 35 minutes.   Pushing a stroller 1  miles in 30 minutes.   Playing basketball for 30 minutes.   Raking leaves for 30 minutes.   Bicycling 5 miles in 30 minutes.   Walking 2 miles in 30 minutes.   Dancing for 30 minutes.   Shoveling snow for 15 minutes.   Swimming laps  for 20 minutes.   Walking up stairs for 15 minutes.   Bicycling 4 miles in 15 minutes.   Gardening for 30 to 45 minutes.   Jumping rope for 15 minutes.   Washing windows or floors for 45 to 60 minutes.  Document Released: 03/08/2010 Document Revised: 10/16/2010 Document Reviewed: 03/08/2010 [facility] Patient Information 2012 [place].Influenza Virus Vaccine (Flucelvax) What is this medicine? INFLUENZA VIRUS VACCINE (in floo EN zuh VAHY ruhs vak SEEN) helps to reduce the risk of getting influenza also known as the flu. The vaccine only helps protect you against some strains of the flu. This medicine may be used for other purposes; ask your health care provider or pharmacist if you have questions. What should I tell my health care provider before I take this medicine? They need to know if you have any of these conditions: -bleeding disorder like hemophilia -fever or infection -Guillain-Barre syndrome or other neurological problems -immune system problems -infection with the human immunodeficiency virus (HIV) or AIDS -low blood platelet counts -multiple sclerosis -an unusual or allergic reaction to influenza virus vaccine, other medicines, foods, dyes or preservatives -pregnant or trying to get pregnant -breast-feeding How should I use this medicine? This vaccine is for injection into a muscle. It is given by a health care professional. A copy of Vaccine Information Statements will be given before each vaccination. Read this sheet carefully each time.  The sheet may change frequently. Talk to your pediatrician regarding the use of this medicine in children. Special care may be needed. Overdosage: If you think you've taken too much of this medicine contact a poison control center or emergency room at once. Overdosage: If you think you have taken too much of this medicine contact a poison control center or emergency room at once. NOTE: This medicine is only for you. Do not share  this medicine with others. What if I miss a dose? This does not apply. What may interact with this medicine? -chemotherapy or radiation therapy -medicines that lower your immune system like etanercept, anakinra, infliximab, and adalimumab -medicines that treat or prevent blood clots like warfarin -phenytoin -steroid medicines like prednisone or cortisone -theophylline -vaccines This list may not describe all possible interactions. Give your health care provider a list of all the medicines, herbs, non-prescription drugs, or dietary supplements you use. Also tell them if you smoke, drink alcohol, or use illegal drugs. Some items may interact with your medicine. What should I watch for while using this medicine? Report any side effects that do not go away within 3 days to your doctor or health care professional. Call your health care provider if any unusual symptoms occur within 6 weeks of receiving this vaccine. You may still catch the flu, but the illness is not usually as bad. You cannot get the flu from the vaccine. The vaccine will not protect against colds or other illnesses that may cause fever. The vaccine is needed every year. What side effects may I notice from receiving this medicine? Side effects that you should report to your doctor or health care professional as soon as possible: -allergic reactions like skin rash, itching or hives, swelling of the face, lips, or tongue Side effects that usually do not require medical attention (Report these to your doctor or health care professional if they continue or are bothersome.): -fever -headache -muscle aches and pains -pain, tenderness, redness, or swelling at the injection site -tiredness This list may not describe all possible side effects. Call your doctor for medical advice about side effects. You may report side effects to FDA at 1-800-FDA-1088. Where should I keep my medicine? The vaccine will be given by a health care  professional in a clinic, pharmacy, doctor's office, or other health care setting. You will not be given vaccine doses to store at home. NOTE: This sheet is a summary. It may not cover all possible information. If you have questions about this medicine, talk to your doctor, pharmacist, or health care provider.    2016, [employer]vier/[employer] Standard. (2011-01-15 14:06:47)

## 2015-12-24 NOTE — Progress Notes (Signed)
Barbara Roman Sep 23, 1955 [id number]   History:    60 y.o.  for annual gyn exam with only complaint is occasional hot flashes. The previous year she had been instructed to discontinue her hormone replacement therapy she had been on it for close to 10 years. She has been using peppermint oil on a when necessary basis and never started the Relizen that I had recommended 1 make every the information so she can order online. Her PCP Dr. Ma Hillock according to the patient has not been doing all her blood work. She has had history vitamin D deficiency and review of her record indicated that in February of last year her vitamin D level was as low as 18 that she was put on 50,000 units weekly for 3 months and return back on January 2017 her vitamin D level rose only to 26. Her PCP extended it for 3 more months but she has not had a follow-up vitamin D level since that time.  Patient had a vaginal hysterectomy 1999 for dysmenorrhea and fibroids.The patient also has a history in 1992 for cervical LEEP conization for dysplasia and subsequent Pap smears have been normal. She had a maternal aunt who had breast cancer in her 12s. At that time she was not checked for BRCA1 or BRCA2. She is still alive and lives and [place]. Patient had a normal colonoscopy in 2009.She had a normal bone density study in 2017.   She has not had her Tdap vaccine. Patient not interested in flu vaccine. She was tested for hepatitis C and was negative in 2014.   Past medical history,surgical history, family history and social history were all reviewed and documented in the EPIC chart.  Gynecologic History Patient's last menstrual period was 07/29/1997. Contraception: post menopausal status Last Pap: 2012 and 2015 . Results were: normal Last mammogram: 2017 . Results were: Had three-dimensional mammogram as a result of dense breasts studies were normal   Obstetric History OB History  Gravida Para Term Preterm AB Living  1 1 1      1   SAB TAB Ectopic Multiple Live Births               # Outcome Date GA Lbr Len/2nd Weight Sex Delivery Anes PTL Lv  1 Term                ROS: A ROS was performed and pertinent positives and negatives are included in the history.  GENERAL: No fevers or chills. HEENT: No change in vision, no earache, sore throat or sinus congestion. NECK: No pain or stiffness. CARDIOVASCULAR: No chest pain or pressure. No palpitations. PULMONARY: No shortness of breath, cough or wheeze. GASTROINTESTINAL: No abdominal pain, nausea, vomiting or diarrhea, melena or bright red blood per rectum. GENITOURINARY: No urinary frequency, urgency, hesitancy or dysuria. MUSCULOSKELETAL: No joint or muscle pain, no back pain, no recent trauma. DERMATOLOGIC: No rash, no itching, no lesions. ENDOCRINE: No polyuria, polydipsia, no heat or cold intolerance. No recent change in weight. HEMATOLOGICAL: No anemia or easy bruising or bleeding. NEUROLOGIC: No headache, seizures, numbness, tingling or weakness. PSYCHIATRIC: No depression, no loss of interest in normal activity or change in sleep pattern.     Exam: chaperone present  BP 130/78   Ht 5' 3"  (1.6 m)   Wt 148 lb (67.1 kg)   LMP 07/29/1997   BMI 26.22 kg/m   Body mass index is 26.22 kg/m.  General appearance : Well developed well nourished female. No acute  distress HEENT: Eyes: no retinal hemorrhage or exudates,  Neck supple, trachea midline, no carotid bruits, no thyroidmegaly Lungs: Clear to auscultation, no rhonchi or wheezes, or rib retractions  Heart: Regular rate and rhythm, no murmurs or gallops Breast:Examined in sitting and supine position were symmetrical in appearance, no palpable masses or tenderness,  no skin retraction, no nipple inversion, no nipple discharge, no skin discoloration, no axillary or supraclavicular lymphadenopathy Abdomen: no palpable masses or tenderness, no rebound or guarding Extremities: no edema or skin discoloration or  tenderness  Pelvic:  Bartholin, Urethra, Skene Glands: Within normal limits             Vagina: No gross lesions or discharge  CervixAbsent   Uterus absent   Adnexa  Without masses or tenderness  Anus and perineum  normal   Rectovaginal  normal sphincter tone without palpated masses or tenderness             Hemoccult will provide when she comes for her blood work.    Assessment/Plan:  60 y.o. female for annual exam will return back later in the week for the following blood were: Comprehensive metabolic panel, fasting lipid profile, TSH, CBC, and urinalysis. Because of her history vitamin D deficiency were going to check her vitamin D level. Last year she was complaining muscle tiredness and fatigue and we assumed it was due to her vitamin D level since her  ANA was tested and was normal. We discussed the new Pap smear guidelines for which she no longer needs.  patient declined flu vaccine.    Terrance Mass MD, 4:44 PM 12/24/2015

## 2015-12-25 ENCOUNTER — Other Ambulatory Visit: Payer: BC Managed Care – PPO

## 2015-12-25 LAB — COMPREHENSIVE METABOLIC PANEL
ALBUMIN: 4.4 g/dL (ref 3.6–5.1)
ALT: 18 U/L (ref 6–29)
AST: 20 U/L (ref 10–35)
Alkaline Phosphatase: 134 U/L — ABNORMAL HIGH (ref 33–130)
BUN: 12 mg/dL (ref 7–25)
CALCIUM: 9.9 mg/dL (ref 8.6–10.4)
CHLORIDE: 104 mmol/L (ref 98–110)
CO2: 24 mmol/L (ref 20–31)
Creat: 0.86 mg/dL (ref 0.50–1.05)
Glucose, Bld: 89 mg/dL (ref 65–99)
Potassium: 4.2 mmol/L (ref 3.5–5.3)
Sodium: 138 mmol/L (ref 135–146)
Total Bilirubin: 0.5 mg/dL (ref 0.2–1.2)
Total Protein: 7.3 g/dL (ref 6.1–8.1)

## 2015-12-25 LAB — TSH: TSH: 5.16 mIU/L — ABNORMAL HIGH

## 2015-12-25 LAB — CBC WITH DIFFERENTIAL/PLATELET
Basophils Absolute: 0 cells/uL (ref 0–200)
Basophils Relative: 0 %
EOS PCT: 2 %
Eosinophils Absolute: 96 cells/uL (ref 15–500)
HEMATOCRIT: 40.7 % (ref 35.0–45.0)
HEMOGLOBIN: 13.5 g/dL (ref 11.7–15.5)
LYMPHS ABS: 1728 {cells}/uL (ref 850–3900)
Lymphocytes Relative: 36 %
MCH: 25.3 pg — AB (ref 27.0–33.0)
MCHC: 33.2 g/dL (ref 32.0–36.0)
MCV: 76.4 fL — ABNORMAL LOW (ref 80.0–100.0)
MONO ABS: 384 {cells}/uL (ref 200–950)
MPV: 8.7 fL (ref 7.5–12.5)
Monocytes Relative: 8 %
NEUTROS PCT: 54 %
Neutro Abs: 2592 cells/uL (ref 1500–7800)
Platelets: 262 10*3/uL (ref 140–400)
RBC: 5.33 MIL/uL — ABNORMAL HIGH (ref 3.80–5.10)
RDW: 15.4 % — ABNORMAL HIGH (ref 11.0–15.0)
WBC: 4.8 10*3/uL (ref 3.8–10.8)

## 2015-12-25 LAB — LIPID PANEL
Cholesterol: 252 mg/dL — ABNORMAL HIGH (ref <200)
HDL: 66 mg/dL (ref >50)
LDL CALC: 165 mg/dL — AB
TRIGLYCERIDES: 104 mg/dL (ref <150)
Total CHOL/HDL Ratio: 3.8 Ratio (ref <5.0)
VLDL: 21 mg/dL (ref <30)

## 2015-12-25 LAB — URINALYSIS W MICROSCOPIC + REFLEX CULTURE
BILIRUBIN URINE: NEGATIVE
Bacteria, UA: NONE SEEN [HPF]
CASTS: NONE SEEN [LPF]
CRYSTALS: NONE SEEN [HPF]
Glucose, UA: NEGATIVE
HGB URINE DIPSTICK: NEGATIVE
Ketones, ur: NEGATIVE
Nitrite: NEGATIVE
Protein, ur: NEGATIVE
RBC / HPF: NONE SEEN RBC/HPF (ref <=2)
Specific Gravity, Urine: 1.011 (ref 1.001–1.035)
Squamous Epithelial / LPF: NONE SEEN [HPF] (ref <=5)
WBC UA: NONE SEEN WBC/HPF (ref <=5)
Yeast: NONE SEEN [HPF]
pH: 7 (ref 5.0–8.0)

## 2015-12-26 ENCOUNTER — Other Ambulatory Visit: Payer: Self-pay | Admitting: Gynecology

## 2015-12-26 DIAGNOSIS — E7800 Pure hypercholesterolemia, unspecified: Secondary | ICD-10-CM

## 2015-12-26 DIAGNOSIS — R945 Abnormal results of liver function studies: Secondary | ICD-10-CM

## 2015-12-26 DIAGNOSIS — R7989 Other specified abnormal findings of blood chemistry: Secondary | ICD-10-CM

## 2015-12-26 LAB — URINE CULTURE: ORGANISM ID, BACTERIA: NO GROWTH

## 2015-12-26 LAB — VITAMIN D 25 HYDROXY (VIT D DEFICIENCY, FRACTURES): Vit D, 25-Hydroxy: 39 ng/mL (ref 30–100)

## 2015-12-31 ENCOUNTER — Other Ambulatory Visit: Payer: Self-pay | Admitting: Anesthesiology

## 2015-12-31 ENCOUNTER — Other Ambulatory Visit: Payer: BC Managed Care – PPO

## 2015-12-31 ENCOUNTER — Other Ambulatory Visit: Payer: Self-pay | Admitting: Gynecology

## 2015-12-31 DIAGNOSIS — R7989 Other specified abnormal findings of blood chemistry: Secondary | ICD-10-CM

## 2015-12-31 DIAGNOSIS — R945 Abnormal results of liver function studies: Secondary | ICD-10-CM

## 2015-12-31 DIAGNOSIS — E7800 Pure hypercholesterolemia, unspecified: Secondary | ICD-10-CM

## 2015-12-31 LAB — ALKALINE PHOSPHATASE: Alkaline Phosphatase: 155 U/L — ABNORMAL HIGH (ref 33–130)

## 2016-01-01 ENCOUNTER — Encounter: Payer: Self-pay | Admitting: Gynecology

## 2016-01-01 LAB — THYROID PANEL WITH TSH
Free Thyroxine Index: 1.7 (ref 1.4–3.8)
T3 Uptake: 32 % (ref 22–35)
T4, Total: 5.3 ug/dL (ref 4.5–12.0)
TSH: 4.75 mIU/L — ABNORMAL HIGH

## 2016-01-01 LAB — LIPID PANEL
CHOL/HDL RATIO: 4.3 ratio (ref <5.0)
Cholesterol: 275 mg/dL — ABNORMAL HIGH (ref <200)
HDL: 64 mg/dL (ref >50)
LDL CALC: 193 mg/dL — AB (ref <100)
TRIGLYCERIDES: 89 mg/dL (ref <150)
VLDL: 18 mg/dL (ref <30)

## 2016-03-17 ENCOUNTER — Other Ambulatory Visit: Payer: Self-pay | Admitting: Gynecology

## 2016-03-17 DIAGNOSIS — Z1231 Encounter for screening mammogram for malignant neoplasm of breast: Secondary | ICD-10-CM

## 2016-04-09 ENCOUNTER — Ambulatory Visit
Admission: RE | Admit: 2016-04-09 | Discharge: 2016-04-09 | Disposition: A | Discharge status: Home / Self Care | Payer: BC Managed Care – PPO | Source: Ambulatory Visit | Attending: Gynecology | Admitting: Gynecology

## 2016-04-09 DIAGNOSIS — Z1231 Encounter for screening mammogram for malignant neoplasm of breast: Secondary | ICD-10-CM

## 2016-06-13 ENCOUNTER — Other Ambulatory Visit: Payer: Self-pay | Admitting: Gynecology

## 2016-06-13 DIAGNOSIS — E7800 Pure hypercholesterolemia, unspecified: Secondary | ICD-10-CM

## 2016-06-24 ENCOUNTER — Other Ambulatory Visit: Payer: BC Managed Care – PPO

## 2016-06-24 DIAGNOSIS — E7800 Pure hypercholesterolemia, unspecified: Secondary | ICD-10-CM

## 2016-06-24 LAB — LIPID PANEL
Cholesterol: 226 mg/dL — ABNORMAL HIGH (ref <200)
HDL: 60 mg/dL (ref >50)
LDL CALC: 151 mg/dL — AB (ref <100)
TRIGLYCERIDES: 75 mg/dL (ref <150)
Total CHOL/HDL Ratio: 3.8 Ratio (ref <5.0)
VLDL: 15 mg/dL (ref <30)

## 2016-07-02 ENCOUNTER — Encounter: Payer: Self-pay | Admitting: Gynecology

## 2016-07-29 ENCOUNTER — Ambulatory Visit (INDEPENDENT_AMBULATORY_CARE_PROVIDER_SITE_OTHER): Payer: BC Managed Care – PPO | Admitting: Family Medicine

## 2016-07-29 ENCOUNTER — Encounter: Payer: Self-pay | Admitting: Family Medicine

## 2016-07-29 ENCOUNTER — Ambulatory Visit
Admission: RE | Admit: 2016-07-29 | Discharge: 2016-07-29 | Disposition: A | Discharge status: Home / Self Care | Payer: BC Managed Care – PPO | Source: Ambulatory Visit | Attending: Family Medicine | Admitting: Family Medicine

## 2016-07-29 VITALS — BP 120/80 | HR 69 | Wt 143.0 lb

## 2016-07-29 DIAGNOSIS — M1990 Unspecified osteoarthritis, unspecified site: Secondary | ICD-10-CM | POA: Insufficient documentation

## 2016-07-29 DIAGNOSIS — M542 Cervicalgia: Secondary | ICD-10-CM

## 2016-07-29 NOTE — Progress Notes (Signed)
   Subjective:    Patient ID: Barbara Roman, female    DOB: 17-Aug-1955, 61 y.o.   MRN: [id number]  HPI She is here for evaluation of various aches and pains. She complains of at least a 3 month history of neck pain that is worse with rotation as well as flexion. She has not tried any medications for this. No numbness, tingling or weakness. She also notes that she is having some right shoulder discomfort especially when she reaches across her chest. No history of injury to the shoulder. Motion in other directions causes no trouble. She is also noted difficulty with pain in her fingers and toes. He she states that they feel swollen but knows that they are not. She has not tried any medications for any of this.   Review of Systems     Objective:   Physical Exam Alert and in no distress. Full motion of the neck with complaint of pain with motion. Normal motor sensory and DTRs. Shoulder exam shows full motion with no tenderness to palpation no murmur or sensory and DTRs. Exam of the elbows wrists and fingers shows full motion with no effusion.       Assessment & Plan:  Neck pain - Plan: DG Cervical Spine Complete  Arthritis I explained all of her symptoms are most likely arthritis and best therapy for this is continuing to work on range of motion and strengthening. Also recommend the use of Tylenol initially and if melena pain relief then use either Advil or Aleve. Also discussed yoga, massage and general physical therapy.

## 2016-07-29 NOTE — Patient Instructions (Signed)
Start with Tylenol for pain relief 2 tablets 4 times per day as needed and if you need to then you can go to either Advil or Aleve

## 2016-11-24 ENCOUNTER — Encounter: Payer: Self-pay | Admitting: Family Medicine

## 2016-12-24 ENCOUNTER — Encounter: Payer: BC Managed Care – PPO | Admitting: Gynecology

## 2016-12-24 ENCOUNTER — Ambulatory Visit: Payer: BC Managed Care – PPO | Admitting: Obstetrics & Gynecology

## 2016-12-24 ENCOUNTER — Encounter: Payer: Self-pay | Admitting: Obstetrics & Gynecology

## 2016-12-24 VITALS — BP 122/76 | Ht 63.0 in | Wt 142.0 lb

## 2016-12-24 DIAGNOSIS — Z780 Asymptomatic menopausal state: Secondary | ICD-10-CM | POA: Diagnosis not present

## 2016-12-24 DIAGNOSIS — Z90710 Acquired absence of both cervix and uterus: Secondary | ICD-10-CM

## 2016-12-24 DIAGNOSIS — Z01411 Encounter for gynecological examination (general) (routine) with abnormal findings: Secondary | ICD-10-CM | POA: Diagnosis not present

## 2016-12-24 NOTE — Progress Notes (Signed)
Barbara Roman April 05, 1955 [id number]   History:    61 y.o. G1P1L1  Widowed.  Just retired.  Enjoying retirement, has a Teacher, early years/pre...    RP:  Established patient presenting for annual gyn exam   HPI:  S/P Vaginal Hysterectomy.  Menopause.  No HRT x 2 years.  Still having hot flushes.  Not sexually active.  Breasts wnl.  Mictions/BMs wnl.  Past medical history,surgical history, family history and social history were all reviewed and documented in the EPIC chart.  Gynecologic History Patient's last menstrual period was 07/29/1997. Contraception: post menopausal status/Hysterectomy Last Pap: 2015. Results were: normal Last mammogram: 2018. Results were: normal Colono 2009 Bone Density 2017  Obstetric History OB History  Gravida Para Term Preterm AB Living  1 1 1     1   SAB TAB Ectopic Multiple Live Births               # Outcome Date GA Lbr Len/2nd Weight Sex Delivery Anes PTL Lv  1 Term                ROS: A ROS was performed and pertinent positives and negatives are included in the history.  GENERAL: No fevers or chills. HEENT: No change in vision, no earache, sore throat or sinus congestion. NECK: No pain or stiffness. CARDIOVASCULAR: No chest pain or pressure. No palpitations. PULMONARY: No shortness of breath, cough or wheeze. GASTROINTESTINAL: No abdominal pain, nausea, vomiting or diarrhea, melena or bright red blood per rectum. GENITOURINARY: No urinary frequency, urgency, hesitancy or dysuria. MUSCULOSKELETAL: No joint or muscle pain, no back pain, no recent trauma. DERMATOLOGIC: No rash, no itching, no lesions. ENDOCRINE: No polyuria, polydipsia, no heat or cold intolerance. No recent change in weight. HEMATOLOGICAL: No anemia or easy bruising or bleeding. NEUROLOGIC: No headache, seizures, numbness, tingling or weakness. PSYCHIATRIC: No depression, no loss of interest in normal activity or change in sleep pattern.     Exam:   Ht 5\' 3"  (1.6 m)   Wt 142 lb (64.4  kg)   LMP 07/29/1997   BMI 25.15 kg/m   Body mass index is 25.15 kg/m.  General appearance : Well developed well nourished female. No acute distress HEENT: Eyes: no retinal hemorrhage or exudates,  Neck supple, trachea midline, no carotid bruits, no thyroidmegaly Lungs: Clear to auscultation, no rhonchi or wheezes, or rib retractions  Heart: Regular rate and rhythm, no murmurs or gallops Breast:Examined in sitting and supine position were symmetrical in appearance, no palpable masses or tenderness,  no skin retraction, no nipple inversion, no nipple discharge, no skin discoloration, no axillary or supraclavicular lymphadenopathy Abdomen: no palpable masses or tenderness, no rebound or guarding Extremities: no edema or skin discoloration or tenderness  Pelvic: Vulva normal  Bartholin, Urethra, Skene Glands: Within normal limits             Vagina: No gross lesions or discharge.  Pap reflex done.  Cervix/Uterus absent  Adnexa  Without masses or tenderness  Anus and perineum  normal    Assessment/Plan:  61 y.o. female for annual exam   1. Encounter for gynecological examination with abnormal finding Gynecologic exam status post hysterectomy.  Pap reflex done.  Breast exam normal.  Recent mammogram normal.  Will be due for colonoscopy in 2019.  Labs with family doctor.  2. Menopause present Symptomatic menopause off hormone replacement therapy times 2 years at age 54.  Risks of stroke and breast cancer associated with hormone replacement therapy discussed  with patient.  Decision made not to restart hormone replacement therapy at this point.  Declines antidepressants to attempt control of hot flashes.  Will try PhytoEstrogens.  Recommend supplements of vitamin D, Calcium rich diet and regular weightbearing physical activity.  3. S/P total hysterectomy   Princess Bruins MD, 4:12 PM 12/24/2016

## 2016-12-26 LAB — PAP IG W/ RFLX HPV ASCU

## 2016-12-28 NOTE — Patient Instructions (Addendum)
1. Encounter for gynecological examination with abnormal finding Gynecologic exam status post hysterectomy.  Pap reflex done.  Breast exam normal.  Recent mammogram normal.  Will be due for colonoscopy in 2019.  Labs with family doctor.  2. Menopause present Symptomatic menopause off hormone replacement therapy times 2 years at age 61.  Risks of stroke and breast cancer associated with hormone replacement therapy discussed with patient.  Decision made not to restart hormone replacement therapy at this point.  Declines antidepressants to attempt control of hot flashes.  Will try PhytoEstrogens.  Recommend supplements of vitamin D, Calcium rich diet and regular weightbearing physical activity.  3. S/P total hysterectomy  Barbara Roman, it was a pleasure meeting you today!  I will inform you of your results as soon as available.   Health Maintenance for Postmenopausal Women Menopause is a normal process in which your reproductive ability comes to an end. This process happens gradually over a span of months to years, usually between the ages of 40 and 50. Menopause is complete when you have missed 12 consecutive menstrual periods. It is important to talk with your health care provider about some of the most common conditions that affect postmenopausal women, such as heart disease, cancer, and bone loss (osteoporosis). Adopting a healthy lifestyle and getting preventive care can help to promote your health and wellness. Those actions can also lower your chances of developing some of these common conditions. What should I know about menopause? During menopause, you may experience a number of symptoms, such as:  Moderate-to-severe hot flashes.  Night sweats.  Decrease in sex drive.  Mood swings.  Headaches.  Tiredness.  Irritability.  Memory problems.  Insomnia.  Choosing to treat or not to treat menopausal changes is an individual decision that you make with your health care provider. What  should I know about hormone replacement therapy and supplements? Hormone therapy products are effective for treating symptoms that are associated with menopause, such as hot flashes and night sweats. Hormone replacement carries certain risks, especially as you become older. If you are thinking about using estrogen or estrogen with progestin treatments, discuss the benefits and risks with your health care provider. What should I know about heart disease and stroke? Heart disease, heart attack, and stroke become more likely as you age. This may be due, in part, to the hormonal changes that your body experiences during menopause. These can affect how your body processes dietary fats, triglycerides, and cholesterol. Heart attack and stroke are both medical emergencies. There are many things that you can do to help prevent heart disease and stroke:  Have your blood pressure checked at least every 1-2 years. High blood pressure causes heart disease and increases the risk of stroke.  If you are 61-3 years old, ask your health care provider if you should take aspirin to prevent a heart attack or a stroke.  Do not use any tobacco products, including cigarettes, chewing tobacco, or electronic cigarettes. If you need help quitting, ask your health care provider.  It is important to eat a healthy diet and maintain a healthy weight. ? Be sure to include plenty of vegetables, fruits, low-fat dairy products, and lean protein. ? Avoid eating foods that are high in solid fats, added sugars, or salt (sodium).  Get regular exercise. This is one of the most important things that you can do for your health. ? Try to exercise for at least 150 minutes each week. The type of exercise that you do should increase  your heart rate and make you sweat. This is known as moderate-intensity exercise. ? Try to do strengthening exercises at least twice each week. Do these in addition to the moderate-intensity exercise.  Know your  numbers.Ask your health care provider to check your cholesterol and your blood glucose. Continue to have your blood tested as directed by your health care provider.  What should I know about cancer screening? There are several types of cancer. Take the following steps to reduce your risk and to catch any cancer development as early as possible. Breast Cancer  Practice breast self-awareness. ? This means understanding how your breasts normally appear and feel. ? It also means doing regular breast self-exams. Let your health care provider know about any changes, no matter how small.  If you are 76 or older, have a clinician do a breast exam (clinical breast exam or CBE) every year. Depending on your age, family history, and medical history, it may be recommended that you also have a yearly breast X-ray (mammogram).  If you have a family history of breast cancer, talk with your health care provider about genetic screening.  If you are at high risk for breast cancer, talk with your health care provider about having an MRI and a mammogram every year.  Breast cancer (BRCA) gene test is recommended for women who have family members with BRCA-related cancers. Results of the assessment will determine the need for genetic counseling and BRCA1 and for BRCA2 testing. BRCA-related cancers include these types: ? Breast. This occurs in males or females. ? Ovarian. ? Tubal. This may also be called fallopian tube cancer. ? Cancer of the abdominal or pelvic lining (peritoneal cancer). ? Prostate. ? Pancreatic.  Cervical, Uterine, and Ovarian Cancer Your health care provider may recommend that you be screened regularly for cancer of the pelvic organs. These include your ovaries, uterus, and vagina. This screening involves a pelvic exam, which includes checking for microscopic changes to the surface of your cervix (Pap test).  For women ages 21-65, health care providers may recommend a pelvic exam and a Pap  test every three years. For women ages 73-65, they may recommend the Pap test and pelvic exam, combined with testing for human papilloma virus (HPV), every five years. Some types of HPV increase your risk of cervical cancer. Testing for HPV may also be done on women of any age who have unclear Pap test results.  Other health care providers may not recommend any screening for nonpregnant women who are considered low risk for pelvic cancer and have no symptoms. Ask your health care provider if a screening pelvic exam is right for you.  If you have had past treatment for cervical cancer or a condition that could lead to cancer, you need Pap tests and screening for cancer for at least 20 years after your treatment. If Pap tests have been discontinued for you, your risk factors (such as having a new sexual partner) need to be reassessed to determine if you should start having screenings again. Some women have medical problems that increase the chance of getting cervical cancer. In these cases, your health care provider may recommend that you have screening and Pap tests more often.  If you have a family history of uterine cancer or ovarian cancer, talk with your health care provider about genetic screening.  If you have vaginal bleeding after reaching menopause, tell your health care provider.  There are currently no reliable tests available to screen for ovarian cancer.  Lung Cancer Lung cancer screening is recommended for adults 50-33 years old who are at high risk for lung cancer because of a history of smoking. A yearly low-dose CT scan of the lungs is recommended if you:  Currently smoke.  Have a history of at least 30 pack-years of smoking and you currently smoke or have quit within the past 15 years. A pack-year is smoking an average of one pack of cigarettes per day for one year.  Yearly screening should:  Continue until it has been 15 years since you quit.  Stop if you develop a health  problem that would prevent you from having lung cancer treatment.  Colorectal Cancer  This type of cancer can be detected and can often be prevented.  Routine colorectal cancer screening usually begins at age 22 and continues through age 19.  If you have risk factors for colon cancer, your health care provider may recommend that you be screened at an earlier age.  If you have a family history of colorectal cancer, talk with your health care provider about genetic screening.  Your health care provider may also recommend using home test kits to check for hidden blood in your stool.  A small camera at the end of a tube can be used to examine your colon directly (sigmoidoscopy or colonoscopy). This is done to check for the earliest forms of colorectal cancer.  Direct examination of the colon should be repeated every 5-10 years until age 91. However, if early forms of precancerous polyps or small growths are found or if you have a family history or genetic risk for colorectal cancer, you may need to be screened more often.  Skin Cancer  Check your skin from head to toe regularly.  Monitor any moles. Be sure to tell your health care provider: ? About any new moles or changes in moles, especially if there is a change in a mole's shape or color. ? If you have a mole that is larger than the size of a pencil eraser.  If any of your family members has a history of skin cancer, especially at a young age, talk with your health care provider about genetic screening.  Always use sunscreen. Apply sunscreen liberally and repeatedly throughout the day.  Whenever you are outside, protect yourself by wearing long sleeves, pants, a wide-brimmed hat, and sunglasses.  What should I know about osteoporosis? Osteoporosis is a condition in which bone destruction happens more quickly than new bone creation. After menopause, you may be at an increased risk for osteoporosis. To help prevent osteoporosis or the  bone fractures that can happen because of osteoporosis, the following is recommended:  If you are 77-59 years old, get at least 1,000 mg of calcium and at least 600 mg of vitamin D per day.  If you are older than age 83 but younger than age 83, get at least 1,200 mg of calcium and at least 600 mg of vitamin D per day.  If you are older than age 35, get at least 1,200 mg of calcium and at least 800 mg of vitamin D per day.  Smoking and excessive alcohol intake increase the risk of osteoporosis. Eat foods that are rich in calcium and vitamin D, and do weight-bearing exercises several times each week as directed by your health care provider. What should I know about how menopause affects my mental health? Depression may occur at any age, but it is more common as you become older. Common symptoms of  include:  Low or sad mood.  Changes in sleep patterns.  Changes in appetite or eating patterns.  Feeling an overall lack of motivation or enjoyment of activities that you previously enjoyed.  Frequent crying spells.  Talk with your health care provider if you think that you are experiencing depression. What should I know about immunizations? It is important that you get and maintain your immunizations. These include:  Tetanus, diphtheria, and pertussis (Tdap) booster vaccine.  Influenza every year before the flu season begins.  Pneumonia vaccine.  Shingles vaccine.  Your health care provider may also recommend other immunizations. This information is not intended to replace advice given to you by your health care provider. Make sure you discuss any questions you have with your health care provider. Document Released: 03/28/2005 Document Revised: 08/24/2015 Document Reviewed: 11/07/2014 Elsevier Interactive Patient Education  2018 [employer]  

## 2017-04-01 ENCOUNTER — Ambulatory Visit: Payer: BC Managed Care – PPO | Admitting: Family Medicine

## 2017-04-01 ENCOUNTER — Encounter: Payer: Self-pay | Admitting: Family Medicine

## 2017-04-01 VITALS — BP 132/80 | HR 66 | Wt 147.2 lb

## 2017-04-01 DIAGNOSIS — L259 Unspecified contact dermatitis, unspecified cause: Secondary | ICD-10-CM | POA: Diagnosis not present

## 2017-04-01 NOTE — Progress Notes (Signed)
   Subjective:    Patient ID: Barbara Roman, female    DOB: 1955/03/09, 62 y.o.   MRN: [id number]  HPI She complains of a 7-10-day history of itching and rash to the anterior chest and different parts of her face.  She has not been exposed to any fumes chemicals smoke.   Review of Systems     Objective:   Physical Exam She has a V-shaped fine slightly erythematous rash present on the anterior chest and to a lesser extent on the neck and scattered follicular lesions present on her face especially the left upper eyelid.       Assessment & Plan:  Contact dermatitis, unspecified contact dermatitis type, unspecified trigger Explained that I thought this was a contact irritant because of the very specific pattern to the anterior chest in a V-shaped pattern.  Recommend cool compresses, cortisone cream and Benadryl at night.

## 2017-04-01 NOTE — Patient Instructions (Signed)
Cool compresses can help.  Cortisone cream on the areas that itch.  Benadryl at night so you can sleep

## 2017-11-16 ENCOUNTER — Encounter: Payer: Self-pay | Admitting: Family Medicine

## 2017-11-16 ENCOUNTER — Ambulatory Visit: Payer: BC Managed Care – PPO | Admitting: Family Medicine

## 2017-11-16 VITALS — BP 120/82 | HR 65 | Temp 97.7°F | Wt 148.4 lb

## 2017-11-16 DIAGNOSIS — Z23 Encounter for immunization: Secondary | ICD-10-CM

## 2017-11-16 DIAGNOSIS — H9202 Otalgia, left ear: Secondary | ICD-10-CM | POA: Diagnosis not present

## 2017-11-16 NOTE — Progress Notes (Signed)
   Subjective:    Patient ID: Barbara Roman, female    DOB: [birth year], 62 y.o.   MRN: [id number]  HPI She complains of an unusual sensation in her left ear and notes this especially when she leans forward.  No pain, drainage, sore throat, neck discomfort.   Review of Systems     Objective:   Physical Exam Alert and in no distress.  Both left and right TMs and canals are totally normal.  No adenopathy noted with her neck.       Assessment & Plan:  Need for influenza vaccination - Plan: Flu Vaccine QUAD 6+ mos PF IM (Fluarix Quad PF)  Ear discomfort, left I reassured her that I did not find any thing other major concern.  She was comfortable with that.

## 2018-03-29 ENCOUNTER — Ambulatory Visit (INDEPENDENT_AMBULATORY_CARE_PROVIDER_SITE_OTHER): Payer: BC Managed Care – PPO | Admitting: Gynecology

## 2018-03-29 ENCOUNTER — Encounter: Payer: Self-pay | Admitting: Gynecology

## 2018-03-29 VITALS — BP 118/76 | Ht 63.0 in | Wt 147.0 lb

## 2018-03-29 DIAGNOSIS — Z01419 Encounter for gynecological examination (general) (routine) without abnormal findings: Secondary | ICD-10-CM

## 2018-03-29 DIAGNOSIS — N952 Postmenopausal atrophic vaginitis: Secondary | ICD-10-CM | POA: Diagnosis not present

## 2018-03-29 NOTE — Progress Notes (Signed)
    Barbara Roman May 21, 1955 [id number]        62 y.o.  G1P1001 for annual gynecologic exam.  Some hot flushes and sweats.  Had been on ERT in the past but discontinued several years ago.  Past medical history,surgical history, problem list, medications, allergies, family history and social history were all reviewed and documented as reviewed in the EPIC chart.  ROS:  Performed with pertinent positives and negatives included in the history, assessment and plan.   Additional significant findings : None   Exam: Caryn Bee assistant Vitals:   03/29/18 1525  BP: 118/76  Weight: 147 lb (66.7 kg)  Height: 5\' 3"  (1.6 m)   Body mass index is 26.04 kg/m.  General appearance:  Normal affect, orientation and appearance. Skin: Grossly normal HEENT: Without gross lesions.  No cervical or supraclavicular adenopathy. Thyroid normal.  Lungs:  Clear without wheezing, rales or rhonchi Cardiac: RR, without RMG Abdominal:  Soft, nontender, without masses, guarding, rebound, organomegaly or hernia Breasts:  Examined lying and sitting without masses, retractions, discharge or axillary adenopathy. Pelvic:  Ext, BUS, Vagina: Normal with atrophic changes  Adnexa: Without masses or tenderness    Anus and perineum: Normal   Rectovaginal: Normal sphincter tone without palpated masses or tenderness.    Assessment/Plan:  63 y.o. G25P1001 female for annual gynecologic exam.  1. Postmenopausal symptoms.  Status post TVH in the past.  Having hot flashes and sweats.  Had been on ERT previously but discontinued several years ago.  We discussed options to include observation, OTC products, pharmacologic nonhormonal such as Effexor and ERT.  The risks and benefits of each choice discussed to include the increased risk of thrombosis such as stroke heart attack DVT in the breast cancer issue with ERT.  At this point the patient is comfortable with trying OTC products.  Will follow-up if continues to be a  significant issue. 2. Pap smear 2018.  No Pap smear done today.  No history of significant abnormal Pap smears.  Reviewed current screening guidelines and options to stop screening with hysterectomy history versus less frequent screening intervals reviewed.  Will readdress on annual basis. 3. Mammography overdue.  Recommended screening mammogram and she agrees to call and schedule.  Breast exam normal today. 4. Colonoscopy 2009.  Patient is going to call [facility] where her last colonoscopy was done to arrange follow-up colonoscopy. 5. DEXA 2017 normal.  Recommend follow-up DEXA at 5-year interval. 6. Health maintenance.  No routine lab work done as patient is going to follow-up with Dr. Redmond School.  Follow-up here in 1 year, sooner as needed.   Anastasio Auerbach MD, 4:03 PM 03/29/2018

## 2018-03-29 NOTE — Patient Instructions (Signed)
Schedule your colonoscopy with either:  [facility]   Address: [address], [place], [place] [address]  [phone]   Call to Schedule your mammogram  Facilities in [place]: 1)  [facility] [place] Phone: [phone]  Dr. Isaiah Blakes at [facility] [address] Phone: [phone]

## 2018-06-07 DIAGNOSIS — Z0279 Encounter for issue of other medical certificate: Secondary | ICD-10-CM

## 2018-06-12 IMAGING — CR DG CERVICAL SPINE COMPLETE 4+V
7 series · 7 of 7 positions shown · non-contrast
Comparison: None.

CLINICAL DATA: Neck pain for 3 months, no trauma, decreased range
of motion at times. Pain radiates to shoulders.

EXAM:
CERVICAL SPINE - COMPLETE 4+ VIEW

[w cervical spine lat]
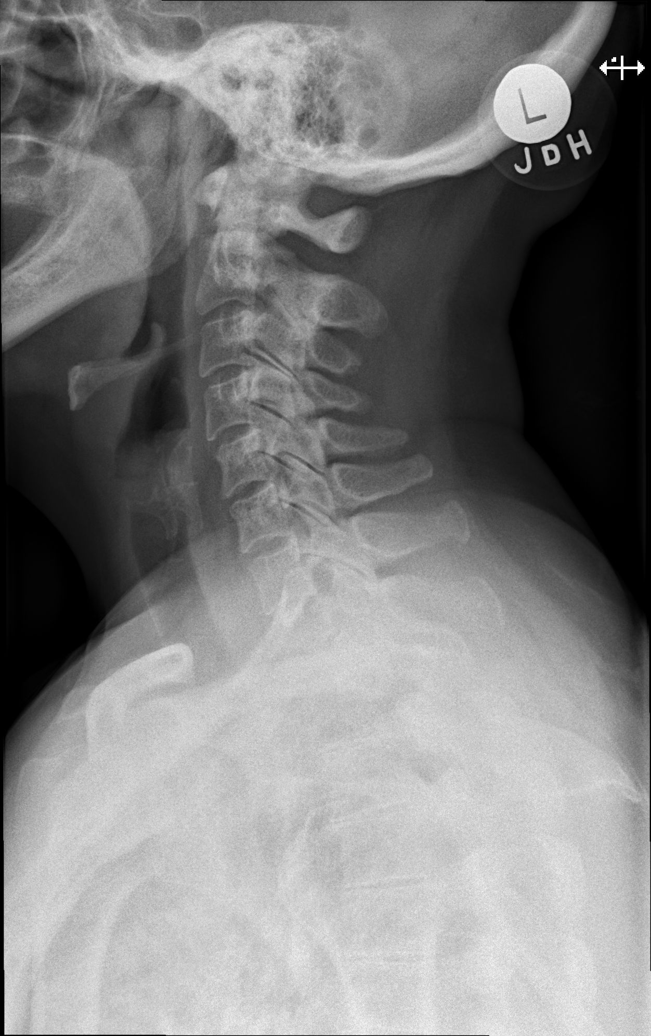

[w cervical spine ap_obl (1 of 2)]
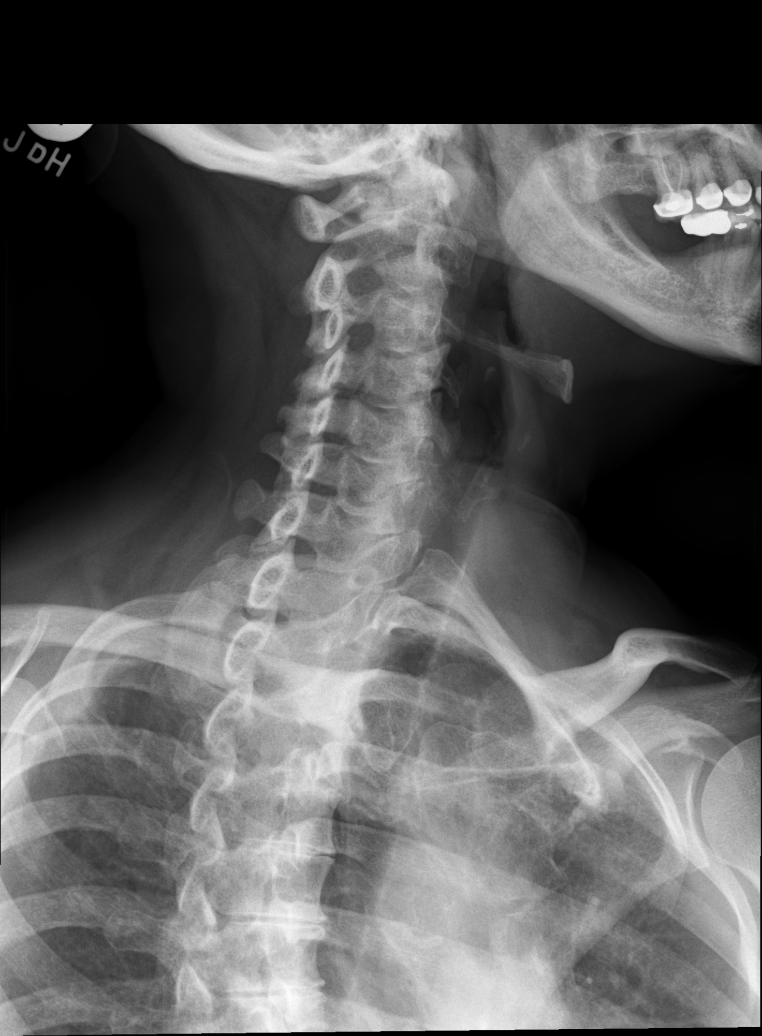

[w cervical spine ap_obl (2 of 2)]
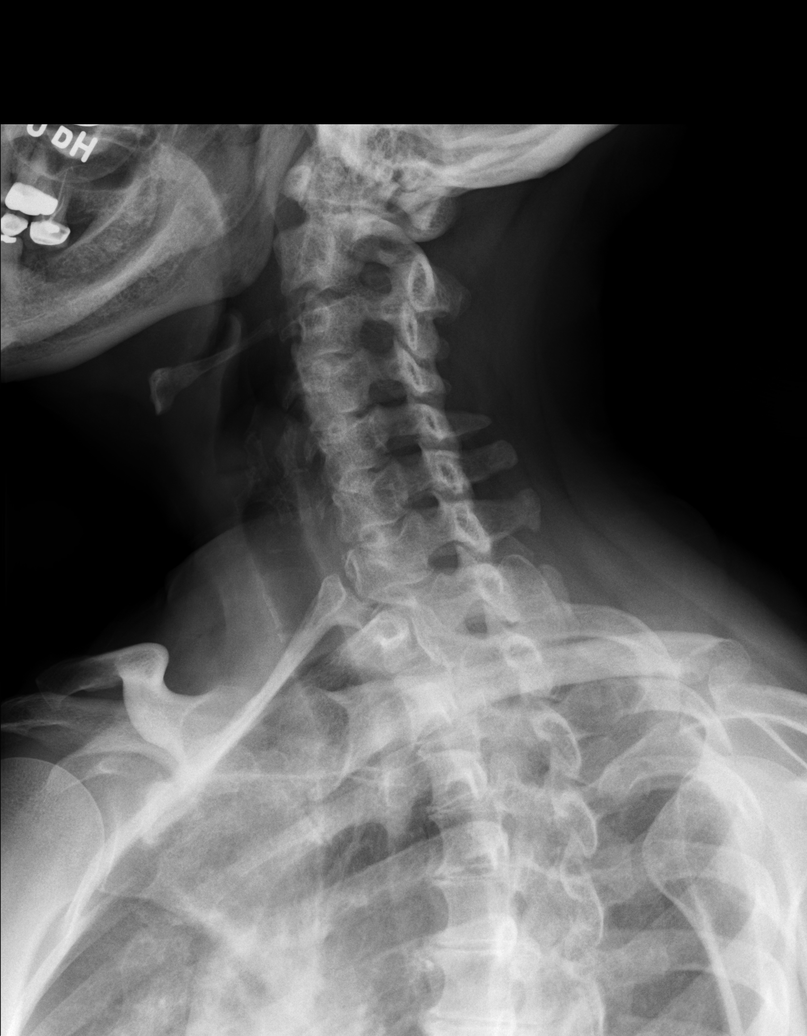

[w cervical spine ap]
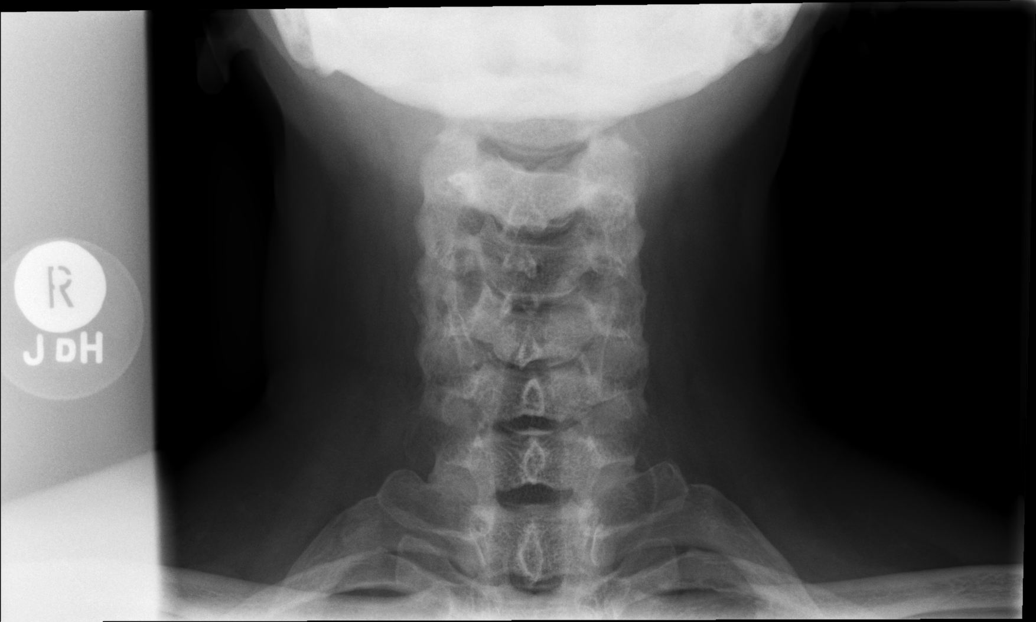

[w cervical swimmers]
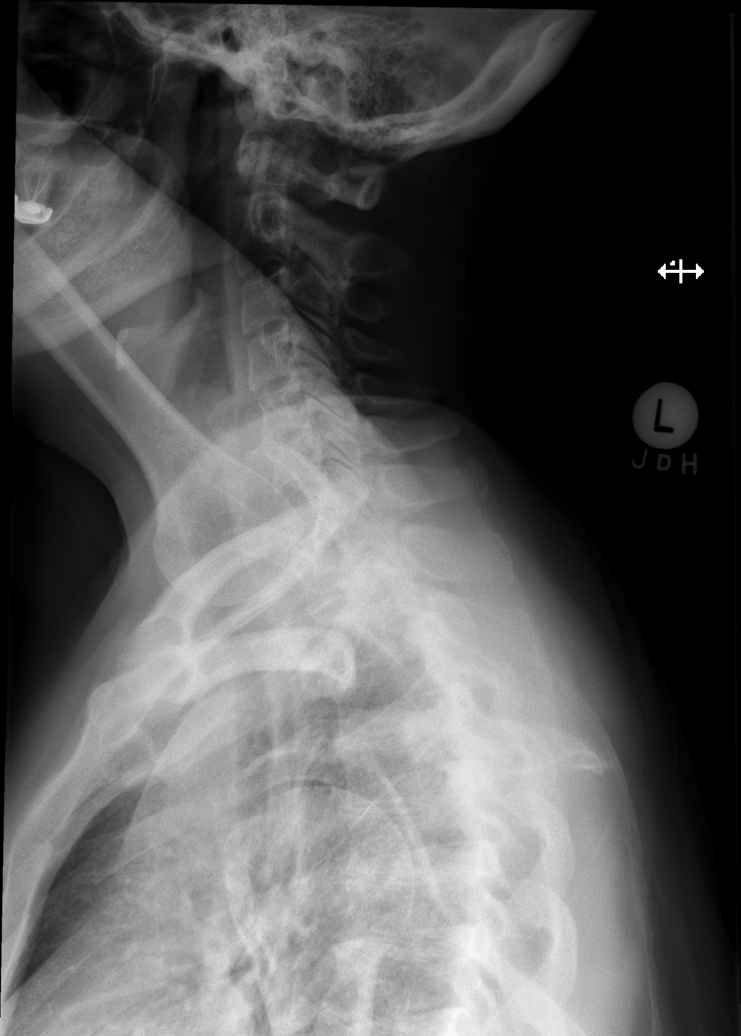

[t cervical spine odontoid (1 of 2)]
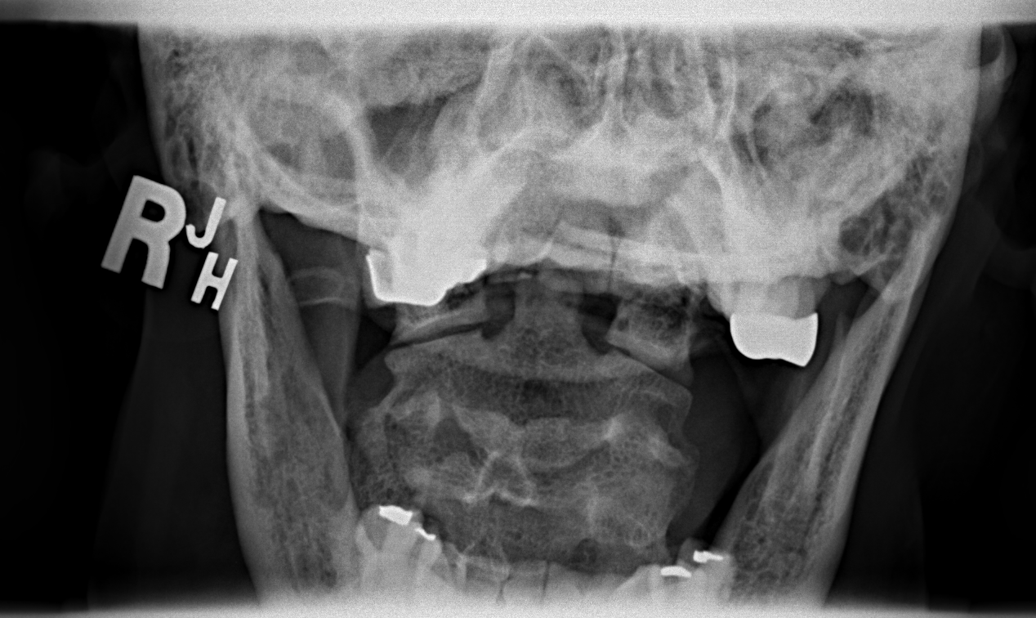

[t cervical spine odontoid (2 of 2)]
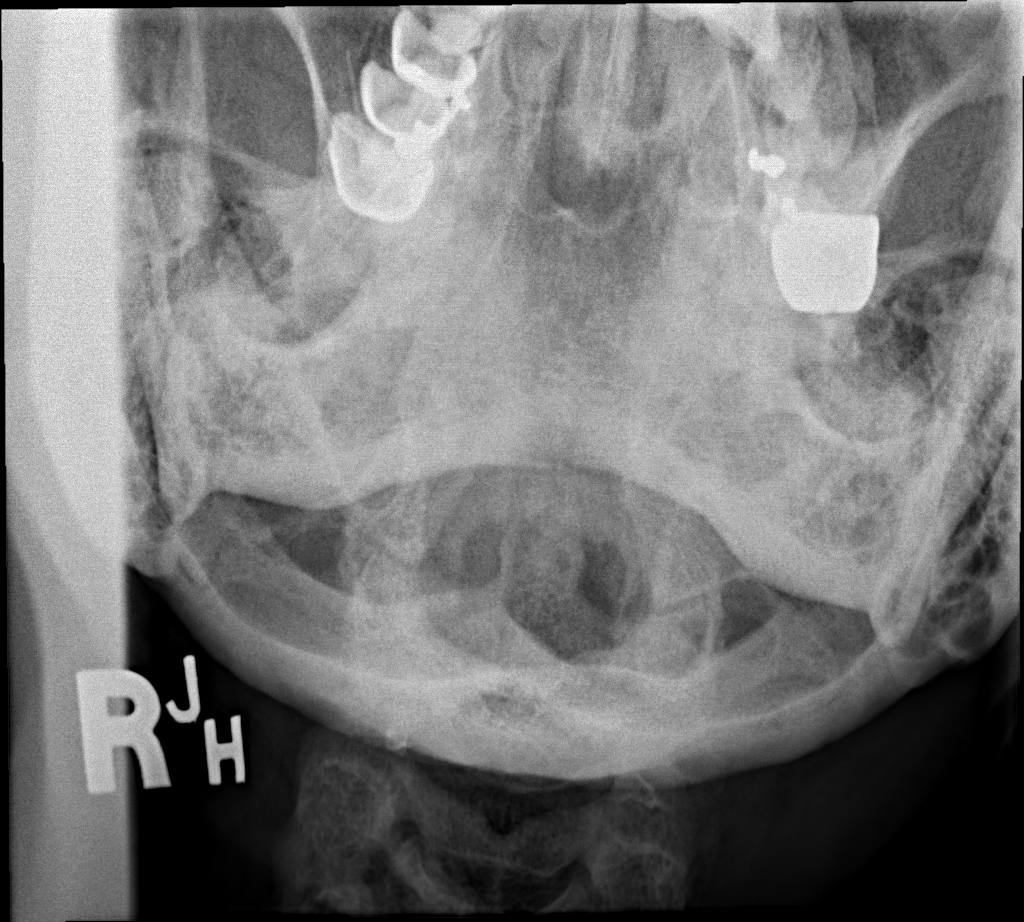

[7 of 7 positions shown; findings below may reference images not displayed]

FINDINGS: The cervical spine is visualized from the occiput to T1. Alignment
is anatomic. Vertebral body height is normal. There may be slight
loss of disc space height at C5-6. Mild uncovertebral hypertrophy in
the mid and lower cervical spine. Neural foramina are patent.
Prevertebral soft tissues are within normal limits.
IMPRESSION: Mild uncovertebral hypertrophy and disc space narrowing at C5-6 and
C6-7.

## 2018-06-17 ENCOUNTER — Telehealth: Payer: Self-pay

## 2018-06-17 NOTE — Telephone Encounter (Signed)
Appt was made. KH 

## 2018-06-17 NOTE — Telephone Encounter (Signed)
Work with her on this.  Does not matter to me

## 2018-06-17 NOTE — Telephone Encounter (Signed)
Patient called and stated she needs to have a physical done for her life insurance plan. She stated if she needs to wait until the pandemic is over to schedule the appointment she can do that. Should we schedule a physical for now or should we wait?

## 2018-07-01 ENCOUNTER — Telehealth: Payer: Self-pay

## 2018-07-01 NOTE — Telephone Encounter (Signed)
LVM for pt to call office for covid screening for CPE Tuesday 07-06-18. We are requesting pt come into the office. [place]

## 2018-07-06 ENCOUNTER — Ambulatory Visit: Payer: BC Managed Care – PPO | Admitting: Family Medicine

## 2018-07-06 ENCOUNTER — Other Ambulatory Visit: Payer: Self-pay

## 2018-07-06 ENCOUNTER — Encounter: Payer: Self-pay | Admitting: Family Medicine

## 2018-07-06 VITALS — BP 128/82 | HR 60 | Temp 97.7°F | Ht 63.0 in | Wt 147.0 lb

## 2018-07-06 DIAGNOSIS — M1990 Unspecified osteoarthritis, unspecified site: Secondary | ICD-10-CM | POA: Diagnosis not present

## 2018-07-06 DIAGNOSIS — Z833 Family history of diabetes mellitus: Secondary | ICD-10-CM | POA: Diagnosis not present

## 2018-07-06 DIAGNOSIS — Z0000 Encounter for general adult medical examination without abnormal findings: Secondary | ICD-10-CM | POA: Diagnosis not present

## 2018-07-06 DIAGNOSIS — E785 Hyperlipidemia, unspecified: Secondary | ICD-10-CM

## 2018-07-06 DIAGNOSIS — Z1211 Encounter for screening for malignant neoplasm of colon: Secondary | ICD-10-CM

## 2018-07-06 DIAGNOSIS — Z8639 Personal history of other endocrine, nutritional and metabolic disease: Secondary | ICD-10-CM | POA: Diagnosis not present

## 2018-07-06 NOTE — Progress Notes (Signed)
   Subjective:    Patient ID: Barbara Roman, female    DOB: June 05, 1955, 63 y.o.   MRN: [id number]  HPI She is here for complete examination.  She has made some diet and exercise changes and is happy with the result.  She is retired but is very physically active.  She does have a family history of diabetes as well as previous history of vitamin D deficiency.  She has been intermittently on multivitamins for that.  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  She did have some foot pain in the past but at this point says she has no true arthritic type symptoms.  She is retired and enjoying her retirement.   Review of Systems  All other systems reviewed and are negative.      Objective:   Physical Exam BP 128/82 (BP Location: Left Arm, Patient Position: Sitting)   Pulse 60   Temp 97.7 F (36.5 C)   Ht 5\' 3"  (1.6 m)   Wt 147 lb (66.7 kg)   LMP 07/29/1997   SpO2 99%   BMI 26.04 kg/m   General Appearance:    Alert, cooperative, no distress, appears stated age  Head:    Normocephalic, without obvious abnormality, atraumatic  Eyes:    PERRL, conjunctiva/corneas clear, EOM's intact, fundi    benign  Ears:    Normal TM's and external ear canals  Nose:   Nares normal, mucosa normal, no drainage or sinus   tenderness  Throat:   Lips, mucosa, and tongue normal; teeth and gums normal  Neck:   Supple, no lymphadenopathy;  thyroid:  no   enlargement/tenderness/nodules; no carotid   bruit or JVD  Back:    Spine nontender, no curvature, ROM normal, no CVA     tenderness  Lungs:     Clear to auscultation bilaterally without wheezes, rales or     ronchi; respirations unlabored  Chest Wall:    No tenderness or deformity   Heart:    Regular rate and rhythm, S1 and S2 normal, no murmur, rub   or gallop  Breast Exam:   Deferred  Abdomen:     Soft, non-tender, nondistended, normoactive bowel sounds,    no masses, no hepatosplenomegaly  Genitalia:   Deferred   Rectal:   Deferred   Extremities:   No clubbing, cyanosis or edema  Pulses:   2+ and symmetric all extremities  Skin:   Skin color, texture, turgor normal, no rashes or lesions  Lymph nodes:   Cervical, supraclavicular, and axillary nodes normal  Neurologic:   CNII-XII intact, normal strength, sensation and gait; reflexes 2+ and symmetric throughout          Psych:   Normal mood, affect, hygiene and grooming.        Assessment & Plan:  Routine general medical examination at a health care facility - Plan: Cologuard, CBC with Differential/Platelet, Lipid panel  Family history of diabetes mellitus - Plan: Comprehensive metabolic panel  H/O vitamin D deficiency - Plan: VITAMIN D 25 Hydroxy (Vit-D Deficiency, Fractures)  Arthritis  Screening for colon cancer - Plan: Cologuard  Hyperlipidemia, unspecified hyperlipidemia type - Plan: Lipid panel We will get the results for her Shingrix documented.  Encouraged her to continue with her very active lifestyle.  Follow-up pending results of blood work.

## 2018-07-08 LAB — COMPREHENSIVE METABOLIC PANEL
ALT: 17 IU/L (ref 0–32)
AST: 20 IU/L (ref 0–40)
Albumin/Globulin Ratio: 1.8 (ref 1.2–2.2)
Albumin: 4.9 g/dL — ABNORMAL HIGH (ref 3.8–4.8)
Alkaline Phosphatase: 118 IU/L — ABNORMAL HIGH (ref 39–117)
BUN/Creatinine Ratio: 13 (ref 12–28)
BUN: 12 mg/dL (ref 8–27)
Bilirubin Total: 0.5 mg/dL (ref 0.0–1.2)
CO2: 19 mmol/L — ABNORMAL LOW (ref 20–29)
Calcium: 10.3 mg/dL (ref 8.7–10.3)
Chloride: 104 mmol/L (ref 96–106)
Creatinine, Ser: 0.94 mg/dL (ref 0.57–1.00)
GFR calc Af Amer: 75 mL/min/{1.73_m2} (ref >59)
GFR calc non Af Amer: 65 mL/min/{1.73_m2} (ref >59)
Globulin, Total: 2.8 g/dL (ref 1.5–4.5)
Glucose: 95 mg/dL (ref 65–99)
Potassium: 4 mmol/L (ref 3.5–5.2)
Sodium: 140 mmol/L (ref 134–144)
Total Protein: 7.7 g/dL (ref 6.0–8.5)

## 2018-07-08 LAB — CBC WITH DIFFERENTIAL/PLATELET
Basophils Absolute: 0 10*3/uL (ref 0.0–0.2)
Basos: 1 %
EOS (ABSOLUTE): 0.1 10*3/uL (ref 0.0–0.4)
Eos: 2 %
Hematocrit: 42 % (ref 34.0–46.6)
Hemoglobin: 14 g/dL (ref 11.1–15.9)
Immature Grans (Abs): 0 10*3/uL (ref 0.0–0.1)
Immature Granulocytes: 0 %
Lymphocytes Absolute: 1.8 10*3/uL (ref 0.7–3.1)
Lymphs: 34 %
MCH: 25.8 pg — ABNORMAL LOW (ref 26.6–33.0)
MCHC: 33.3 g/dL (ref 31.5–35.7)
MCV: 77 fL — ABNORMAL LOW (ref 79–97)
Monocytes Absolute: 0.4 10*3/uL (ref 0.1–0.9)
Monocytes: 8 %
Neutrophils Absolute: 2.9 10*3/uL (ref 1.4–7.0)
Neutrophils: 55 %
Platelets: 301 10*3/uL (ref 150–450)
RBC: 5.43 x10E6/uL — ABNORMAL HIGH (ref 3.77–5.28)
RDW: 14.5 % (ref 11.7–15.4)
WBC: 5.2 10*3/uL (ref 3.4–10.8)

## 2018-07-08 LAB — LIPID PANEL
Chol/HDL Ratio: 4.5 ratio — ABNORMAL HIGH (ref 0.0–4.4)
Cholesterol, Total: 254 mg/dL — ABNORMAL HIGH (ref 100–199)
HDL: 57 mg/dL (ref >39)
LDL Calculated: 177 mg/dL — ABNORMAL HIGH (ref 0–99)
Triglycerides: 102 mg/dL (ref 0–149)
VLDL Cholesterol Cal: 20 mg/dL (ref 5–40)

## 2018-07-08 LAB — VITAMIN D 25 HYDROXY (VIT D DEFICIENCY, FRACTURES): Vit D, 25-Hydroxy: 20.3 ng/mL — ABNORMAL LOW (ref 30.0–100.0)

## 2018-07-13 ENCOUNTER — Other Ambulatory Visit: Payer: Self-pay

## 2018-07-13 MED ORDER — ERGOCALCIFEROL 1.25 MG (50000 UT) PO CAPS: 50000.0000 [IU] | ORAL_CAPSULE | ORAL | 0 refills | Status: DC

## 2018-07-26 ENCOUNTER — Telehealth: Payer: Self-pay | Admitting: Family Medicine

## 2018-07-26 NOTE — Telephone Encounter (Signed)
Pt needs copy of labs and avs mailed.  Done.

## 2018-08-05 LAB — COLOGUARD: Cologuard: NEGATIVE

## 2018-08-25 ENCOUNTER — Other Ambulatory Visit: Payer: Self-pay

## 2018-08-25 ENCOUNTER — Encounter: Payer: Self-pay | Admitting: Family Medicine

## 2018-08-25 ENCOUNTER — Ambulatory Visit (INDEPENDENT_AMBULATORY_CARE_PROVIDER_SITE_OTHER): Payer: BC Managed Care – PPO | Admitting: Family Medicine

## 2018-08-25 VITALS — Temp 98.2°F | Wt 142.0 lb

## 2018-08-25 DIAGNOSIS — R0981 Nasal congestion: Secondary | ICD-10-CM

## 2018-08-25 DIAGNOSIS — S29011A Strain of muscle and tendon of front wall of thorax, initial encounter: Secondary | ICD-10-CM

## 2018-08-25 DIAGNOSIS — L237 Allergic contact dermatitis due to plants, except food: Secondary | ICD-10-CM

## 2018-08-25 MED ORDER — TRIAMCINOLONE ACETONIDE 0.1 % EX CREA: 1.0000 "application " | TOPICAL_CREAM | Freq: Two times a day (BID) | CUTANEOUS | 0 refills | Status: DC

## 2018-08-25 NOTE — Addendum Note (Signed)
Addended by: Girtha Rm on: 08/25/2018 12:05 PM   Modules accepted: Orders

## 2018-08-25 NOTE — Progress Notes (Addendum)
Subjective:   Documentation for virtual audio and video telecommunications through [facility] encounter:  The patient was located at home. 2 patient identifiers used.  The provider was located in the office. The patient did consent to this visit and is aware of possible charges through their insurance for this visit.  The other persons participating in this telemedicine service were none.   Patient ID: Barbara Roman, female    DOB: 05-Jan-1956, 63 y.o.   MRN: [id number]  HPI Chief Complaint  Patient presents with  . rash on both arms    rash on both arms, thinks it might be poision, itchy, redness, not spreading   Complains of pruritic rash on her bilateral lower arms for the past week. Has some small spots on her chin and cheek as well. Not as bothersome on the face. States she was working outside last week and came in contact with plants.  States she has been using campho-phenique and oatmeal baths.  She questions whether she should take steroids or not.  States she has chest wall soreness due to lifting heavy bags of mulch and other heavy items.  Pain is only with certain movements. States she also has mild nasal congestion.  States she took a sinus medication yesterday, a decongestant.  This is improving. Denies rhinorrhea, sneezing, sore throat.  Denies any recent known exposure to COVID. Has not been in any large groups or enclosed areas with people.  Denies fever, chills, body aches, palpitations, shortness of breath, cough, abdominal pain, nausea, vomiting, diarrhea.  No loss of taste or smell.  Reviewed allergies, medications, past medical, surgical, family, and social history.     Review of Systems Pertinent positives and negatives in the history of present illness.     Objective:   Physical Exam Temp 98.2 F (36.8 C) (Oral)   Wt 142 lb (64.4 kg)   LMP 07/29/1997   BMI 25.15 kg/m   Alert and oriented in no acute distress.  Respirations unlabored.  No sinus  tenderness when she palpates her sinuses.  She has 2 or 3 small raised bumps on her chin without any sign of inflammation.  Difficult to visualize on camera phone.  Bilateral lower arms with erythema and pruritic rash.  No edema, exudate or induration apparent.      Assessment & Plan:  Allergic contact dermatitis due to plants, except food - Plan: see below   Nasal congestion - Plan: see below  Chest wall muscle strain, initial encounter - Plan: see below   Discussed limitations of virtual visit. She is not in any acute distress.  No obvious signs of infection. Discussed treatment options for rash and since currently the rash is only on her forearms and very mild on her chin we will use topical steroids.  She will use triamcinolone on the arms and over-the-counter hydrocortisone on her face.  Advised her not to use this more than 1 week and if she is worsening or not improving, she will call.  We will not use an oral steroid at this time.  Advised her to use cool compresses, cool showers and she may take Benadryl at bedtime if needed for itching.  Educated her on signs and symptoms of infection. She may continue using a decongestant for nasal congestion.  This seems most likely related to working outside over the past several days.  No other URI symptoms. Chest wall is sore and reproducible pain with movements.  Advised her to try to avoid heavy lifting  and she may try Tylenol or ibuprofen if needed.  Time spent on call was 24 minutes and in review of previous records 2 minutes total.  This virtual service is not related to other E/M service within previous 7 days.

## 2018-09-02 ENCOUNTER — Telehealth: Payer: Self-pay | Admitting: Internal Medicine

## 2018-09-02 NOTE — Telephone Encounter (Signed)
Pt was advised [facility] 

## 2018-09-02 NOTE — Telephone Encounter (Signed)
Have her to continue to use cold compresses, Benadryl at night and Allegra during the day as well as cortisone cream.  If she still having difficulty on Monday have her call.

## 2018-09-02 NOTE — Telephone Encounter (Signed)
Pt called and states that she has used 7 days worth of cream for her poison ivy. However she is still having itching and its still on her arms and wants to know if she needs a steroid to get this gone.

## 2018-09-05 ENCOUNTER — Other Ambulatory Visit: Payer: Self-pay | Admitting: Family Medicine

## 2018-09-06 ENCOUNTER — Other Ambulatory Visit: Payer: Self-pay

## 2018-09-06 DIAGNOSIS — Z8639 Personal history of other endocrine, nutritional and metabolic disease: Secondary | ICD-10-CM

## 2018-09-06 NOTE — Telephone Encounter (Signed)
Do you want pt to come back for labs on vitamin D. Please advise [facility]

## 2018-09-06 NOTE — Telephone Encounter (Signed)
She needs to come in for nurse visit for blood work

## 2018-09-07 ENCOUNTER — Other Ambulatory Visit: Payer: Self-pay

## 2018-09-07 ENCOUNTER — Other Ambulatory Visit: Payer: BC Managed Care – PPO

## 2018-09-07 DIAGNOSIS — Z8639 Personal history of other endocrine, nutritional and metabolic disease: Secondary | ICD-10-CM

## 2018-09-08 LAB — VITAMIN D 25 HYDROXY (VIT D DEFICIENCY, FRACTURES): Vit D, 25-Hydroxy: 36.1 ng/mL (ref 30.0–100.0)

## 2018-11-16 ENCOUNTER — Encounter: Payer: Self-pay | Admitting: Gynecology

## 2018-11-23 ENCOUNTER — Other Ambulatory Visit (INDEPENDENT_AMBULATORY_CARE_PROVIDER_SITE_OTHER): Payer: BC Managed Care – PPO

## 2018-11-23 ENCOUNTER — Other Ambulatory Visit: Payer: Self-pay

## 2018-11-23 DIAGNOSIS — Z23 Encounter for immunization: Secondary | ICD-10-CM

## 2019-02-01 ENCOUNTER — Ambulatory Visit: Payer: BC Managed Care – PPO | Admitting: Family Medicine

## 2019-02-01 ENCOUNTER — Other Ambulatory Visit: Payer: Self-pay

## 2019-02-01 ENCOUNTER — Encounter: Payer: Self-pay | Admitting: Family Medicine

## 2019-02-01 VITALS — BP 128/74 | HR 84 | Temp 98.0°F | Wt 143.8 lb

## 2019-02-01 DIAGNOSIS — M791 Myalgia, unspecified site: Secondary | ICD-10-CM | POA: Diagnosis not present

## 2019-02-01 NOTE — Progress Notes (Signed)
   Subjective:    Patient ID: Barbara Roman, female    DOB: June 04, 1955, 63 y.o.   MRN: [id number]  HPI She complains of a 9-month history of aches and pains in her neck, upper back, upper chest and arms.  During the same timeframe she has been doing a lot of yard work.  No numbness, tingling, weakness, joint pains.  No fever or chills.   Review of Systems     Objective:   Physical Exam Alert and in no distress.  Full motion of the neck, shoulder and arms.  No palpable tenderness to the muscles or joints.       Assessment & Plan:  Myalgia I explained that at this point this is probably overuse of her muscles.  Recommend supportive care by backing off on physical activity and using Tylenol/Advil or Aleve for relief of symptoms.  She was comfortable with that.  She will return here if symptoms continue after she rests.

## 2019-02-01 NOTE — Patient Instructions (Signed)
You can take up to 4 Advil 3 times per day.  Back off on physical activity for the next week

## 2019-03-10 ENCOUNTER — Ambulatory Visit: Payer: BC Managed Care – PPO | Admitting: Family Medicine

## 2019-03-10 ENCOUNTER — Encounter: Payer: Self-pay | Admitting: Family Medicine

## 2019-03-10 ENCOUNTER — Other Ambulatory Visit: Payer: Self-pay

## 2019-03-10 VITALS — BP 130/80 | HR 75 | Temp 96.0°F | Wt 144.6 lb

## 2019-03-10 DIAGNOSIS — M7918 Myalgia, other site: Secondary | ICD-10-CM | POA: Diagnosis not present

## 2019-03-10 NOTE — Patient Instructions (Signed)
Take 2 Aleve twice per day regularly for the next 2 weeks.  If you are still having trouble after that let me know.  Do gentle stretching on a day-to-day basis .  Hot tub baths can also help

## 2019-03-10 NOTE — Progress Notes (Signed)
   Subjective:    Patient ID: Barbara Roman, female    DOB: 09/05/1955, 64 y.o.   MRN: [id number]  HPI She continues to have difficulty with mid back discomfort as well as some right flank as well as anterior chest and lower abdominal pain.  These usually bother her when she is physically active moving.  Standing and walking do not seem to cause any trouble.  She has been intermittently using an NSAID.  She complains of no weakness, numbness or tingling.   Review of Systems     Objective:   Physical Exam Alert and in no distress.  No palpable tenderness to her mid back, flank, lower abdominal area.  Minimal discomfort in the mid chest area.  Lungs are clear to auscultation.  Cardiac exam shows regular rhythm without murmurs or gallops.      Assessment & Plan:  Musculoskeletal pain I explained that at this time I would get more aggressive with medication.Take 2 Aleve twice per day regularly for the next 2 weeks.  If you are still having trouble after that let me know.  Do gentle stretching on a day-to-day basis .  Hot tub baths can also help If she continues have difficulty, she is to call for reevaluation and possible referral.

## 2019-04-13 ENCOUNTER — Ambulatory Visit: Payer: BC Managed Care – PPO | Admitting: Family Medicine

## 2019-04-13 ENCOUNTER — Other Ambulatory Visit: Payer: Self-pay

## 2019-04-13 VITALS — BP 138/80 | HR 76 | Temp 97.1°F | Wt 145.0 lb

## 2019-04-13 DIAGNOSIS — R600 Localized edema: Secondary | ICD-10-CM

## 2019-04-13 NOTE — Progress Notes (Signed)
   Subjective:    Patient ID: Barbara Roman, female    DOB: January 06, 1956, 64 y.o.   MRN: [id number]  HPI She complains of a 2-week history of difficulty with facial edema and itching.  It occurs intermittently over the whole day.  She has had no new soaps, detergents, colognes.  She cannot relate this to foods.  She notes no lesions anywhere else on her body.  She had no fever, chills.   Review of Systems     Objective:   Physical Exam Alert and in no distress.  Minimal facial edema is noted with very fine rash present between her eyes and on her cheeks.  No erythema.  Cornea and conjunctive appear normal.  TMs clear.  Neck is supple without adenopathy.  Throat is clear.       Assessment & Plan:  Facial edema - Plan: Ambulatory referral to Dermatology Use cool compresses on your face and try some Claritin or Allegra for the itching you can also use cortisone cream sparingly.

## 2019-04-13 NOTE — Patient Instructions (Signed)
Use cool compresses on your face and try some Claritin or Allegra for the itching you can also use cortisone cream sparingly.

## 2019-04-19 ENCOUNTER — Other Ambulatory Visit: Payer: Self-pay | Admitting: Pharmacy Technician

## 2019-04-19 NOTE — Patient Outreach (Signed)
[facility]) Care Management  04/19/2019  Barbara Roman Nov 02, 1955 [id number]   Error.  Myia Bergh P. Deosha Werden, [place]  [phone]

## 2019-05-24 ENCOUNTER — Ambulatory Visit: Payer: BC Managed Care – PPO | Admitting: Family Medicine

## 2019-05-24 ENCOUNTER — Encounter: Payer: Self-pay | Admitting: Family Medicine

## 2019-05-24 ENCOUNTER — Other Ambulatory Visit: Payer: Self-pay

## 2019-05-24 VITALS — BP 128/76 | HR 77 | Temp 97.5°F | Wt 145.4 lb

## 2019-05-24 DIAGNOSIS — M940 Chondrocostal junction syndrome [Tietze]: Secondary | ICD-10-CM | POA: Diagnosis not present

## 2019-05-24 NOTE — Progress Notes (Signed)
   Subjective:    Patient ID: Barbara Roman, female    DOB: 04-Jan-1956, 64 y.o.   MRN: [id number]  HPI She complains of a 29-month history of mid chest discomfort that she describes as palpable.  It occurs roughly once per month and lasts roughly a day.  There is no associated shortness of breath, coughing, fever, chills.  No associated heart rate changes.   Review of Systems     Objective:   Physical Exam Alert and in no distress.  Questionable tenderness over the second left costochondral junction.  Lungs are clear to auscultation.  Cardiac exam shows regular rhythm without murmurs or gallops.     Assessment & Plan:  Costochondritis I explained that this is a chest wall issue not related to heart or lungs.  Explained that x-rays as well as further cardiac or pulmonary testing would not be appropriate especially since this goes away fairly quickly.  She seemed relieved about this.

## 2019-06-21 ENCOUNTER — Other Ambulatory Visit: Payer: Self-pay

## 2019-06-21 ENCOUNTER — Encounter: Payer: Self-pay | Admitting: Family Medicine

## 2019-06-21 ENCOUNTER — Ambulatory Visit (INDEPENDENT_AMBULATORY_CARE_PROVIDER_SITE_OTHER): Payer: BC Managed Care – PPO | Admitting: Family Medicine

## 2019-06-21 VITALS — BP 124/82 | HR 74 | Temp 98.4°F | Wt 142.6 lb

## 2019-06-21 DIAGNOSIS — R0789 Other chest pain: Secondary | ICD-10-CM

## 2019-06-21 DIAGNOSIS — F418 Other specified anxiety disorders: Secondary | ICD-10-CM | POA: Diagnosis not present

## 2019-06-21 NOTE — Progress Notes (Signed)
   Subjective:    Patient ID: Barbara Roman, female    DOB: [birth year], 64 y.o.   MRN: [id number]  HPI She states that earlier today she noted some left-sided discomfort that radiated into her back.  Further discussion indicates that motion does make this worse.  Breathing has no effect.  Physical activity has no effect on its.  No shortness of breath weakness, numbness.  She then had questions concerning Covid.  She also admits to still being quite angry but no particular reasons.   Review of Systems     Objective:   Physical Exam Alert and in no distress.  Cardiac exam shows regular rhythm without murmurs gallops.  Lungs are clear to auscultation.  Chest no chest wall tenderness.  The pain was made worse with certain motions.       Assessment & Plan:  Musculoskeletal chest pain  Anxiety about health I explained in detail her musculoskeletal pain and the fact that it is not related to any heart or lung related issues. I then discussed stress and anxiety with her.  Strongly advised her to get involved in counseling as she has a hard time handling the stress around health especially around Covid.  She has very paranoid about avoiding any contact.  He she has gotten her shots.  Over 30 minutes spent discussing all these issues with her.

## 2019-07-07 ENCOUNTER — Encounter: Payer: Self-pay | Admitting: Family Medicine

## 2019-07-07 ENCOUNTER — Other Ambulatory Visit: Payer: Self-pay

## 2019-07-07 ENCOUNTER — Ambulatory Visit: Payer: BC Managed Care – PPO | Admitting: Family Medicine

## 2019-07-07 VITALS — BP 118/74 | HR 75 | Temp 98.3°F | Ht 64.0 in | Wt 141.0 lb

## 2019-07-07 DIAGNOSIS — E785 Hyperlipidemia, unspecified: Secondary | ICD-10-CM | POA: Diagnosis not present

## 2019-07-07 DIAGNOSIS — F325 Major depressive disorder, single episode, in full remission: Secondary | ICD-10-CM

## 2019-07-07 DIAGNOSIS — Z0000 Encounter for general adult medical examination without abnormal findings: Secondary | ICD-10-CM

## 2019-07-07 DIAGNOSIS — Z8639 Personal history of other endocrine, nutritional and metabolic disease: Secondary | ICD-10-CM | POA: Diagnosis not present

## 2019-07-07 DIAGNOSIS — Z833 Family history of diabetes mellitus: Secondary | ICD-10-CM

## 2019-07-07 NOTE — Progress Notes (Signed)
   Subjective:    Patient ID: Barbara Roman, female    DOB: 1955/05/20, 64 y.o.   MRN: [id number]  HPI She is here for complete examination.  She does have history of hyperlipidemia but is not on medication.  Does not have a family history of this.  She does have a previous history of depression but this revolved around her late husband and taking care of him.  This was approximately 4 years ago.  Now she is doing quite nicely and is involved in another relationship that she is about to terminate.  She states that psychologically she is doing quite well.  She is now retired.  Her son is moving back with her for a short period of time.  She will follow-up with gynecology and will set up to get a mammogram in the near future.  Family and social history as well as health maintenance immunizations was reviewed   Review of Systems  All other systems reviewed and are negative.      Objective:   Physical Exam Alert and in no distress. Tympanic membranes and canals are normal. Pharyngeal area is normal. Neck is supple without adenopathy or thyromegaly. Cardiac exam shows a regular sinus rhythm without murmurs or gallops. Lungs are clear to auscultation.        Assessment & Plan:  Routine general medical examination at a health care facility - Plan: Comprehensive metabolic panel, CBC with Differential/Platelet, Lipid panel  Hyperlipidemia, unspecified hyperlipidemia type - Plan: Lipid panel  H/O vitamin D deficiency - Plan: VITAMIN D 25 Hydroxy (Vit-D Deficiency, Fractures)  Family history of diabetes mellitus - Plan: Comprehensive metabolic panel, CBC with Differential/Platelet  Major depression, single episode, in complete remission ([place]) She is doing quite nicely with her retirement.  Briefly discussed role reversal in regard to her mother and also make sure that the son was in but for a short period of time.  Also encouraged him to get the Covid vaccine.

## 2019-07-08 LAB — LIPID PANEL
Chol/HDL Ratio: 3.7 ratio (ref 0.0–4.4)
Cholesterol, Total: 266 mg/dL — ABNORMAL HIGH (ref 100–199)
HDL: 72 mg/dL (ref >39)
LDL Chol Calc (NIH): 184 mg/dL — ABNORMAL HIGH (ref 0–99)
Triglycerides: 65 mg/dL (ref 0–149)
VLDL Cholesterol Cal: 10 mg/dL (ref 5–40)

## 2019-07-08 LAB — CBC WITH DIFFERENTIAL/PLATELET
Basophils Absolute: 0 10*3/uL (ref 0.0–0.2)
Basos: 1 %
EOS (ABSOLUTE): 0.1 10*3/uL (ref 0.0–0.4)
Eos: 1 %
Hematocrit: 40.9 % (ref 34.0–46.6)
Hemoglobin: 13.7 g/dL (ref 11.1–15.9)
Immature Grans (Abs): 0 10*3/uL (ref 0.0–0.1)
Immature Granulocytes: 0 %
Lymphocytes Absolute: 1.8 10*3/uL (ref 0.7–3.1)
Lymphs: 30 %
MCH: 25.6 pg — ABNORMAL LOW (ref 26.6–33.0)
MCHC: 33.5 g/dL (ref 31.5–35.7)
MCV: 76 fL — ABNORMAL LOW (ref 79–97)
Monocytes Absolute: 0.7 10*3/uL (ref 0.1–0.9)
Monocytes: 11 %
Neutrophils Absolute: 3.4 10*3/uL (ref 1.4–7.0)
Neutrophils: 57 %
Platelets: 319 10*3/uL (ref 150–450)
RBC: 5.36 x10E6/uL — ABNORMAL HIGH (ref 3.77–5.28)
RDW: 14.4 % (ref 11.7–15.4)
WBC: 5.9 10*3/uL (ref 3.4–10.8)

## 2019-07-08 LAB — COMPREHENSIVE METABOLIC PANEL
ALT: 16 IU/L (ref 0–32)
AST: 21 IU/L (ref 0–40)
Albumin/Globulin Ratio: 1.8 (ref 1.2–2.2)
Albumin: 4.7 g/dL (ref 3.8–4.8)
Alkaline Phosphatase: 101 IU/L (ref 48–121)
BUN/Creatinine Ratio: 12 (ref 12–28)
BUN: 11 mg/dL (ref 8–27)
Bilirubin Total: 0.5 mg/dL (ref 0.0–1.2)
CO2: 25 mmol/L (ref 20–29)
Calcium: 10.2 mg/dL (ref 8.7–10.3)
Chloride: 101 mmol/L (ref 96–106)
Creatinine, Ser: 0.91 mg/dL (ref 0.57–1.00)
GFR calc Af Amer: 78 mL/min/{1.73_m2} (ref >59)
GFR calc non Af Amer: 67 mL/min/{1.73_m2} (ref >59)
Globulin, Total: 2.6 g/dL (ref 1.5–4.5)
Glucose: 85 mg/dL (ref 65–99)
Potassium: 5.2 mmol/L (ref 3.5–5.2)
Sodium: 139 mmol/L (ref 134–144)
Total Protein: 7.3 g/dL (ref 6.0–8.5)

## 2019-07-08 LAB — VITAMIN D 25 HYDROXY (VIT D DEFICIENCY, FRACTURES): Vit D, 25-Hydroxy: 23.2 ng/mL — ABNORMAL LOW (ref 30.0–100.0)

## 2019-07-29 ENCOUNTER — Other Ambulatory Visit: Payer: Self-pay | Admitting: Family Medicine

## 2019-07-29 DIAGNOSIS — Z1231 Encounter for screening mammogram for malignant neoplasm of breast: Secondary | ICD-10-CM

## 2019-08-10 ENCOUNTER — Ambulatory Visit
Admission: RE | Admit: 2019-08-10 | Discharge: 2019-08-10 | Disposition: A | Discharge status: Home / Self Care | Payer: BC Managed Care – PPO | Source: Ambulatory Visit | Attending: Family Medicine | Admitting: Family Medicine

## 2019-08-10 ENCOUNTER — Other Ambulatory Visit: Payer: Self-pay

## 2019-08-10 DIAGNOSIS — Z1231 Encounter for screening mammogram for malignant neoplasm of breast: Secondary | ICD-10-CM

## 2019-09-28 ENCOUNTER — Other Ambulatory Visit: Payer: Self-pay

## 2019-09-28 ENCOUNTER — Encounter: Payer: Self-pay | Admitting: Family Medicine

## 2019-09-28 ENCOUNTER — Ambulatory Visit: Payer: BC Managed Care – PPO | Admitting: Family Medicine

## 2019-09-28 VITALS — BP 120/70 | HR 72 | Ht 63.0 in | Wt 142.2 lb

## 2019-09-28 DIAGNOSIS — M79602 Pain in left arm: Secondary | ICD-10-CM | POA: Diagnosis not present

## 2019-09-28 DIAGNOSIS — R079 Chest pain, unspecified: Secondary | ICD-10-CM | POA: Diagnosis not present

## 2019-09-28 DIAGNOSIS — M25512 Pain in left shoulder: Secondary | ICD-10-CM

## 2019-09-28 MED ORDER — MELOXICAM 15 MG PO TABS: 15.0000 mg | ORAL_TABLET | Freq: Every day | ORAL | 0 refills | Status: DC

## 2019-09-28 NOTE — Progress Notes (Signed)
Chief Complaint  Patient presents with  . Consult    feels like there is a knot in between her shoulder blades x 1 week. More on left side. Uncomfortable. Does not make a difference whether or not she is lying, sitting or standing. Does not hurt more when she takes a deep breath.    Feels like she has a knot between the shoulder blades, more on the left. Noticed this about a week ago. She took aleve muscle and back, didn't help, but took tylenol which did help. Hasn't taken recently. Sometimes get a discomfort in the sternum, and an "uncomfortable feeling" in the left arm. Sometimes it feels like she has gas in her chest, usually relieved by Gas-X, but no tthis time. Currently having pain in the left shoulder blade, and L arm doesn't feel normal.  She is concerned about this, and the fact that she has had chest concerns for a few months (seen by Dr. Redmond School, told [facility] in nature, in April, May).  Using eating pad, putting pressure on the area helps (in her back). Pulling her shoulders back makes it feel better, stretching forwards can feel more discomfort.  PMH, PSH, SH reviewed  Outpatient Encounter Medications as of 09/28/2019  Medication Sig Note  . desonide (DESOWEN) 0.05 % cream APPLY TO FACE UP TO TWICE A DAY AS NEEDED 09/28/2019: Used some this week for a rash  . Multiple Vitamins-Minerals (ZINC PO) Take by mouth.    . triamcinolone cream (KENALOG) 0.1 % Apply 1 application topically 2 (two) times daily. 09/28/2019: Using for arms  . [DISCONTINUED] augmented betamethasone dipropionate (DIPROLENE-AF) 0.05 % cream    . Ascorbic Acid (VITAMIN C PO) Take by mouth. (Patient not taking: Reported on 09/28/2019)   . GARLIC PO Take by mouth. (Patient not taking: Reported on 09/28/2019)   . ibuprofen (ADVIL) 200 MG tablet Take 200 mg by mouth every 6 (six) hours as needed. (Patient not taking: Reported on 09/28/2019)   . Plant Sterols and Stanols (CHOLEST OFF PO) Take by mouth. (Patient not taking:  Reported on 09/28/2019)   . VITAMIN D PO Take by mouth. (Patient not taking: Reported on 09/28/2019)   . [DISCONTINUED] ergocalciferol (VITAMIN D2) 1.25 MG (50000 UT) capsule Take 1 capsule (50,000 Units total) by mouth once a week. Take 50,000 units by mouth once a week for eight weeks (Patient not taking: Reported on 02/01/2019)    No facility-administered encounter medications on file as of 09/28/2019.   ROS: no fever, chills, headaches, dizziness, numbness, tingling, weakness.  No exertional chest pain, palpitations. No shortness of breath.  No bleeding, bruising.  No GI or GU complaints.  See HPI   PHYSICAL EXAM:  BP 120/70   Pulse 72   Ht 5\' 3"  (1.6 m)   Wt 142 lb 3.2 oz (64.5 kg)   LMP 07/29/1997   BMI 25.19 kg/m   Well-appearing, pleasant female.  Slightly anxious (and had to lay down when had a hot flash during visit). HEENT: conjunctiva and sclera are clear, EOMI, wearing mask Neck: no lymphadenopathy, thyromegaly or carotid bruit. FROM at neck, no spasm. Heart: regular rate and rhythm, no murmur Chest wall: nontender to palpation Lungs: clear bilaterally Abdomen: soft, nontender, no mass Extremities: no edema, 2+ pulses Back: No spinal tenderness, CVA tenderness, SI joint tenderness. She is tender at the lower left trapezius muscles, slightly tighter than on the left. No rhomboid tenderness or spasm. Extremities: no edema Neuro: alert and oriented, normal strength, gait, DTR's.  EKG: sinus bradycardia, rate 53. Borderline LAE, otherwise no significant abnl.   ASSESSMENT/PLAN:  Acute pain of left shoulder - tender over trapezius muscle, normal shoulder exam. Heat, stretch, massage, NSAID. PT if not improving - Plan: meloxicam (MOBIC) 15 MG tablet  Left arm pain  Intermittent chest pain - chest wall nontender. EKG reassuring.  - Plan: EKG 12-Lead  Risks/SE of NSAIDs reviewed in detail. Shown stretches.   Apply heat for 15 minutes at least 3 times daily. Do the  neck stretches as shown after using the heat (chin to chest, ear to shoulder, and looking over the shoulder, in both directions) Also try and massage the painful area after the stretches.  Take the anti-inflammatory medication once daily with food. Do NOT take Aleve or other anti-inflammatories along with it. You MAY take Tylenol in addition to the prescription, if needed for pain relief.  Next step would be physical therapy. If you aren't improving, let us know and we can refer you to the PT location of your choice.  Restart D, and continue your multivitamin daily.

## 2019-09-28 NOTE — Patient Instructions (Signed)
  Apply heat for 15 minutes at least 3 times daily. Do the neck stretches as shown after using the heat (chin to chest, ear to shoulder, and looking over the shoulder, in both directions) Also try and massage the painful area after the stretches.  Take the anti-inflammatory medication once daily with food. Do NOT take Aleve or other anti-inflammatories along with it. You MAY take Tylenol in addition to the prescription, if needed for pain relief.  Next step would be physical therapy. If you aren't improving, let us know and we can refer you to the PT location of your choice.  Restart D, and continue your multivitamin daily.

## 2019-10-10 ENCOUNTER — Other Ambulatory Visit: Payer: Self-pay | Admitting: Family Medicine

## 2019-10-10 ENCOUNTER — Encounter: Payer: Self-pay | Admitting: *Deleted

## 2019-10-10 DIAGNOSIS — M25512 Pain in left shoulder: Secondary | ICD-10-CM

## 2020-01-26 ENCOUNTER — Other Ambulatory Visit: Payer: Self-pay

## 2020-01-26 ENCOUNTER — Encounter: Payer: Self-pay | Admitting: Family Medicine

## 2020-01-26 ENCOUNTER — Ambulatory Visit: Payer: BC Managed Care – PPO | Admitting: Family Medicine

## 2020-01-26 VITALS — BP 127/71 | HR 65 | Temp 97.3°F | Wt 139.6 lb

## 2020-01-26 DIAGNOSIS — S8002XA Contusion of left knee, initial encounter: Secondary | ICD-10-CM

## 2020-01-26 DIAGNOSIS — R002 Palpitations: Secondary | ICD-10-CM | POA: Diagnosis not present

## 2020-01-26 DIAGNOSIS — Z23 Encounter for immunization: Secondary | ICD-10-CM

## 2020-01-26 DIAGNOSIS — M79675 Pain in left toe(s): Secondary | ICD-10-CM

## 2020-01-26 DIAGNOSIS — M25512 Pain in left shoulder: Secondary | ICD-10-CM

## 2020-01-26 NOTE — Progress Notes (Signed)
   Subjective:    Patient ID: Barbara Roman, female    DOB: 07/02/1955, 64 y.o.   MRN: [id number]  HPI She is here for complaint of multiple issues.  She has had difficulty over the last 3 months with left shoulder pain that is worse with abduction and external rotation.  No history of injury to that.  She also fell and injured her left knee to the medial joint line and is still having difficulty with it but no popping, locking or grinding.  She states that last Friday she had 2 episodes of palpitations but did not check her pulse rate.  She had no weakness, chest pain, shortness of breath,.  She has had no previous difficulty with that.  She also complains of left great toe pain and is worried about the ingrown toenail.   Review of Systems     Objective:   Physical Exam Alert and in no distress.  Cardiac exam shows regular rhythm without murmurs or gallops.  Lungs are clear to auscultation.  Exam of the left shoulder shows full motion of the shoulder.  No palpable tenderness.  Neer's and Hawkins test negative.  Negative sulcus sign.  Left knee exam shows no tenderness to palpation, effusion.  Ligaments intact.  Negative anterior drawer.  Exam of the toe shows no obvious pathology.  EKG shows normal sinus rhythm with other indices being normal       Assessment & Plan:  Acute pain of left shoulder  Need for COVID-19 vaccine - Plan: Pfizer SARS-COV-2 Vaccine  Palpitations - Plan: EKG 12-Lead  Contusion of left knee, initial encounter  Great toe pain, left Heat for your shoulder and your knee for about 20 minutes 3 times per day and gentle stretching on the shoulder.  For both the shoulder and the knee you can take 2 Aleve twice per day for the next 10 days to 2 weeks.  If the shoulder keeps bothering you I will send you to physical therapy.  The knee just more needs more time.. Do not wear tight fitting shoes The next time he had palpitations check your pulse and let me know how  fast it is and is that regular or irregular Over 30 minutes spent discussing these issues with her.

## 2020-01-26 NOTE — Patient Instructions (Addendum)
Heat for your shoulder and your knee for about 20 minutes 3 times per day and gentle stretching on the shoulder.  For both the shoulder and the knee you can take 2 Aleve twice per day for the next 10 days to 2 weeks.  If the shoulder keeps bothering you I will send you to physical therapy.  The knee just more needs more time.. Do not wear tight fitting shoes The next time he had palpitations check your pulse and let me know how fast it is and is that regular or irregular

## 2020-06-06 ENCOUNTER — Ambulatory Visit: Payer: BC Managed Care – PPO | Admitting: Family Medicine

## 2020-06-06 ENCOUNTER — Encounter: Payer: Self-pay | Admitting: Family Medicine

## 2020-06-06 ENCOUNTER — Other Ambulatory Visit: Payer: Self-pay

## 2020-06-06 VITALS — BP 130/80 | HR 60 | Temp 98.5°F | Ht 63.0 in | Wt 142.2 lb

## 2020-06-06 DIAGNOSIS — J358 Other chronic diseases of tonsils and adenoids: Secondary | ICD-10-CM

## 2020-06-06 DIAGNOSIS — K1379 Other lesions of oral mucosa: Secondary | ICD-10-CM

## 2020-06-06 DIAGNOSIS — K120 Recurrent oral aphthae: Secondary | ICD-10-CM | POA: Diagnosis not present

## 2020-06-06 MED ORDER — LIDOCAINE VISCOUS HCL 2 % MT SOLN: 5.0000 mL | Freq: Four times a day (QID) | OROMUCOSAL | 0 refills | Status: DC | PRN

## 2020-06-06 NOTE — Patient Instructions (Signed)
Use the magic mouthwash as directed (gargle and spit, don't swallow)  Follow up with your dentist for ongoing problems if not improving (for the gum problems, not the tonsils).  Do gargles for tonsillar concretion (salt water). If you have persistent discomfort there, we can send you to an ENT doctor. You have a tonsillar concretion on the left tonsil, causing the discomfort in the left throat/neck.  If any ongoing problems, we can refer you to an ENT to further address. For now--bland diet (nothing spicy, acidic, citrus).   Canker Sores  Canker sores are small, painful sores that develop inside your mouth. You can get one or more canker sores on the inside of your lips or cheeks, on your tongue, or anywhere inside your mouth. Canker sores cannot be passed from person to person (are not contagious). These sores are different from the sores that you may get on the outside of your lips (cold sores or fever blisters). What are the causes? The cause of this condition is not known. The condition may be passed down from a parent (genetic). What increases the risk? This condition is more likely to develop in:  Women.  People in their teens or 69s.  Women who are having their menstrual period.  People who are under a lot of emotional stress.  People who do not get enough iron or B vitamins.  People who do not take care of their mouth and teeth (have poor oral hygiene).  People who have an injury inside the mouth, such as after having dental work or from chewing something hard. What are the signs or symptoms? Canker sores usually start as painful red bumps. Then they turn into small white, yellow, or gray sores that have red borders. The sores may be painful, and the pain may get worse when you eat or drink. Along with the canker sore, symptoms may also include:  Fever.  Fatigue.  Swollen lymph nodes in your neck. How is this diagnosed? This condition may be diagnosed based on your  symptoms and an exam of the inside of your mouth. If you get canker sores often or if they are very bad, you may have tests, such as:  Blood tests to rule out possible causes.  Swabbing a fluid sample from the sore to be tested for infection.  Removing a small tissue sample from the sore (biopsy) to test it for cancer. How is this treated? Most canker sores go away without treatment in about 1 week. Home care is usually the only treatment that you will need. Over-the-counter medicines can relieve discomfort. If you have severe canker sores, your health care provider may prescribe:  Numbing ointment to relieve pain. ? Do not use numbing gels or products containing benzocaine in children who are 20 years of age or younger.  Vitamins.  Steroid medicines. These may be given as pills, mouth rinses, or gels.  Antibiotic mouth rinse. Follow these instructions at home:  Apply, take, or use over-the-counter and prescription medicines only as told by your health care provider. These include vitamins and ointments.  If you were prescribed an antibiotic mouth rinse, use it as told by your health care provider. Do not stop using the antibiotic even if your condition improves.  Until the sores are healed: ? Do not drink coffee or citrus juices. ? Do not eat spicy or salty foods.  Use a mild, over-the-counter mouth rinse as recommended by your health care provider.  Practice good oral hygiene by: ?  Flossing your teeth every day. ? Brushing your teeth with a soft toothbrush twice each day.   Contact a health care provider if:  Your symptoms do not get better after 2 weeks.  You also have a fever or swollen glands in your neck.  You get canker sores often.  You have a canker sore that is getting larger.  You cannot eat or drink due to your canker sores. Summary  Canker sores are small, painful sores that develop inside your mouth.  Canker sores usually start as painful red bumps that  turn into small white, yellow, or gray sores that have red borders.  The sores may be quite painful, and the pain may get worse when you eat or drink.  Most canker sores clear up without treatment in about 1 week. Over-the-counter medicines can relieve discomfort. This information is not intended to replace advice given to you by your health care provider. Make sure you discuss any questions you have with your health care provider. Document Revised: 10/28/2019 Document Reviewed: 10/28/2019 Elsevier Patient Education  2021 [employer].

## 2020-06-06 NOTE — Progress Notes (Signed)
Chief Complaint  Patient presents with  . Allergic Reaction    Possible allergic reaction. Ate some crab legs at least a week ago. Jaw on right side feels rough and are sore and the inside of her lips. She thinks it may be swollen as well. Inside her mouth on the left side feels like some skin is being pulled.   . Immunizations    Offered Covid booster, she would prefer to wait.    Patient presents with mouth complaints.  A week ago she noticed the inside of the R cheek wasn't smooth Also on the inside top and bottom parts of the lip and corners of her lips have also started feeling raw in the last few days.  On L throat--felt like something got lodged (popcorn or peanut), feels different--"pulled"  She normally uses [employer] baking soda and peroxide toothpaste.  It started burning on the gums (cheek, areas described above), so went back to a regular [place] (sample from dentist), which is less bothersome. Eating/drinking doesn't seem to cause discomfort, but this toothpaste did.  Denies dysphagia, heartburn. If she goes out in the yard, she gets some PND. Having some itching at the lateral corners of her eyes, and has dry eyes.  Clear Eyes help. Not taking allergy medications. +carbonated beverages.  Crab was a new food she had eaten, so wondering if she could be allergic. Never had mouth swelling, hives, itching, rash.  PMH, PSH, SH reviewed  Outpatient Encounter Medications as of 06/06/2020  Medication Sig Note  . Multiple Vitamins-Minerals (ZINC PO) Take by mouth.    . Plant Sterols and Stanols (CHOLEST OFF PO) Take by mouth.   Marland Kitchen VITAMIN D PO Take by mouth.   . Ascorbic Acid (VITAMIN C PO) Take by mouth. (Patient not taking: Reported on 06/06/2020)   . BLACK ELDERBERRY PO Take by mouth. (Patient not taking: Reported on 06/06/2020)   . desonide (DESOWEN) 0.05 % cream APPLY TO FACE UP TO TWICE A DAY AS NEEDED (Patient not taking: Reported on 06/06/2020) 09/28/2019: Used some this  week for a rash  . GARLIC PO Take by mouth. (Patient not taking: No sig reported)   . ibuprofen (ADVIL) 200 MG tablet Take 200 mg by mouth every 6 (six) hours as needed. (Patient not taking: No sig reported)   . triamcinolone cream (KENALOG) 0.1 % Apply 1 application topically 2 (two) times daily. (Patient not taking: Reported on 06/06/2020) 09/28/2019: Using for arms  . [DISCONTINUED] meloxicam (MOBIC) 15 MG tablet Take 1 tablet (15 mg total) by mouth daily. Take daily until pain resolved. Cut the dose in half if it bothers your stomach.  Take with food. (Patient not taking: Reported on 01/26/2020)    No facility-administered encounter medications on file as of 06/06/2020.   No Known Allergies  ROS: no fever, chills, URI symptoms.  Occ allergy symptoms per HPI.  No headache, dizziness, chest pain. No shortness of breath, chest tightness, hives. She had a rash on her R neck "from being outside", prescribed a cream from dermatologist, and that cleared it up (steroid cream, uses prn).   PHYSICAL EXAM:  BP 130/80   Pulse 60   Temp 98.5 F (36.9 C) (Tympanic)   Ht 5\' 3"  (1.6 m)   Wt 142 lb 3.2 oz (64.5 kg)   LMP 07/29/1997   BMI 25.19 kg/m   Well-appearing, pleasant female, in no distress.  No visible swelling or asymmetry to face, about the mouth. HEENT: conjunctiva and  sclera are clear, EOMI OP: tonsillar concretion noted on left upper tonsil. No erythema. R buccal mucosa--prominent papilla (from parotid gland). Just posterior and inferior to this is an area of irritation, which is sensitive per patient. Seems slightly linear. No ulceration There is a small ulceration on the inner portion of lower lip/mucosa. Neck: no lymphadenopathy, thyromegaly or mass    ASSESSMENT/PLAN:  Tonsil stone - reassured. Trial gargling  Aphthous ulcer of mouth - supportive measures, magic mouthwash, educated - Plan: magic mouthwash (lidocaine, diphenhydrAMINE, alum & mag hydroxide) suspension  Mouth  pain - prominent papilla R cheek, and some linear irritation--?related to scratch/trauma. No e/o allergic reaction. Trial Magic Mouthwash - Plan: magic mouthwash (lidocaine, diphenhydrAMINE, alum & mag hydroxide) suspension    aphthous ulcer tonsillar concretion Irritated mucosa   F/u with ENT and/or dentist regarding ongoing complaints (ENT for tonsils)

## 2020-07-10 ENCOUNTER — Encounter: Payer: Self-pay | Admitting: Family Medicine

## 2020-07-10 ENCOUNTER — Ambulatory Visit: Payer: BC Managed Care – PPO | Admitting: Family Medicine

## 2020-07-10 ENCOUNTER — Other Ambulatory Visit: Payer: Self-pay

## 2020-07-10 VITALS — BP 128/78 | HR 65 | Temp 97.0°F | Ht 62.0 in | Wt 141.4 lb

## 2020-07-10 DIAGNOSIS — Z0000 Encounter for general adult medical examination without abnormal findings: Secondary | ICD-10-CM

## 2020-07-10 DIAGNOSIS — S2096XA Insect bite (nonvenomous) of unspecified parts of thorax, initial encounter: Secondary | ICD-10-CM | POA: Diagnosis not present

## 2020-07-10 DIAGNOSIS — Z833 Family history of diabetes mellitus: Secondary | ICD-10-CM

## 2020-07-10 DIAGNOSIS — Z8639 Personal history of other endocrine, nutritional and metabolic disease: Secondary | ICD-10-CM | POA: Diagnosis not present

## 2020-07-10 DIAGNOSIS — W57XXXA Bitten or stung by nonvenomous insect and other nonvenomous arthropods, initial encounter: Secondary | ICD-10-CM

## 2020-07-10 DIAGNOSIS — E785 Hyperlipidemia, unspecified: Secondary | ICD-10-CM

## 2020-07-10 NOTE — Progress Notes (Signed)
   Subjective:    Patient ID: Barbara Roman, female    DOB: February 29, 1955, 65 y.o.   MRN: [id number]  HPI She is here for complete examination.  She is on no prescription medications but does have a history of hyperlipidemia.  She is on multiple over-the-counter meds and having no difficulty with that.  She does have a family history of diabetes.  She also has history of vitamin D deficiency. She is now retired and enjoying her retirement.  Family and social history as well as health maintenance and immunizations was reviewed.  She does not smoke or drink and has had a hysterectomy. Review of Systems  All other systems reviewed and are negative.      Objective:   Physical Exam Alert and in no distress. Tympanic membranes and canals are normal. Pharyngeal area is normal. Neck is supple without adenopathy or thyromegaly. Cardiac exam shows a regular sinus rhythm without murmurs or gallops. Lungs are clear to auscultation. Exam of the left chest area does show a tick present that is engorged.  It is approximately 1 cm in size.      Assessment & Plan:  Routine general medical examination at a health care facility - Plan: Lipid panel, Comprehensive metabolic panel, CBC with Differential/Platelet  Tick bite of thoracic wall, unspecified whether front or back, initial encounter  Hyperlipidemia, unspecified hyperlipidemia type - Plan: Lipid panel  H/O vitamin D deficiency - Plan: VITAMIN D 25 Hydroxy (Vit-D Deficiency, Fractures)  Family history of diabetes mellitus - Plan: Comprehensive metabolic panel Discussed symptoms to watch out for in regard to clinic including fever, headache and rash. Also recommend she get more physically active with 20 minutes of something physical daily or 150 minutes week of something.

## 2020-07-10 NOTE — Patient Instructions (Addendum)
Watch out for fever, headache and rash and if he have them please me 20 minutes of something physical daily or 150 minutes a week of something

## 2020-07-11 LAB — CBC WITH DIFFERENTIAL/PLATELET
Basophils Absolute: 0 10*3/uL (ref 0.0–0.2)
Basos: 1 %
EOS (ABSOLUTE): 0.1 10*3/uL (ref 0.0–0.4)
Eos: 2 %
Hematocrit: 40.7 % (ref 34.0–46.6)
Hemoglobin: 13.5 g/dL (ref 11.1–15.9)
Immature Grans (Abs): 0 10*3/uL (ref 0.0–0.1)
Immature Granulocytes: 0 %
Lymphocytes Absolute: 1.7 10*3/uL (ref 0.7–3.1)
Lymphs: 33 %
MCH: 25.1 pg — ABNORMAL LOW (ref 26.6–33.0)
MCHC: 33.2 g/dL (ref 31.5–35.7)
MCV: 76 fL — ABNORMAL LOW (ref 79–97)
Monocytes Absolute: 0.6 10*3/uL (ref 0.1–0.9)
Monocytes: 11 %
Neutrophils Absolute: 2.7 10*3/uL (ref 1.4–7.0)
Neutrophils: 53 %
Platelets: 280 10*3/uL (ref 150–450)
RBC: 5.37 x10E6/uL — ABNORMAL HIGH (ref 3.77–5.28)
RDW: 14.1 % (ref 11.7–15.4)
WBC: 5.1 10*3/uL (ref 3.4–10.8)

## 2020-07-11 LAB — COMPREHENSIVE METABOLIC PANEL
ALT: 29 IU/L (ref 0–32)
AST: 26 IU/L (ref 0–40)
Albumin/Globulin Ratio: 1.7 (ref 1.2–2.2)
Albumin: 4.7 g/dL (ref 3.8–4.8)
Alkaline Phosphatase: 124 IU/L — ABNORMAL HIGH (ref 44–121)
BUN/Creatinine Ratio: 12 (ref 12–28)
BUN: 11 mg/dL (ref 8–27)
Bilirubin Total: 0.4 mg/dL (ref 0.0–1.2)
CO2: 23 mmol/L (ref 20–29)
Calcium: 10.1 mg/dL (ref 8.7–10.3)
Chloride: 101 mmol/L (ref 96–106)
Creatinine, Ser: 0.91 mg/dL (ref 0.57–1.00)
Globulin, Total: 2.8 g/dL (ref 1.5–4.5)
Glucose: 93 mg/dL (ref 65–99)
Potassium: 4.4 mmol/L (ref 3.5–5.2)
Sodium: 138 mmol/L (ref 134–144)
Total Protein: 7.5 g/dL (ref 6.0–8.5)
eGFR: 70 mL/min/{1.73_m2} (ref >59)

## 2020-07-11 LAB — LIPID PANEL
Chol/HDL Ratio: 3.8 ratio (ref 0.0–4.4)
Cholesterol, Total: 263 mg/dL — ABNORMAL HIGH (ref 100–199)
HDL: 69 mg/dL (ref >39)
LDL Chol Calc (NIH): 181 mg/dL — ABNORMAL HIGH (ref 0–99)
Triglycerides: 77 mg/dL (ref 0–149)
VLDL Cholesterol Cal: 13 mg/dL (ref 5–40)

## 2020-07-11 LAB — VITAMIN D 25 HYDROXY (VIT D DEFICIENCY, FRACTURES): Vit D, 25-Hydroxy: 26.7 ng/mL — ABNORMAL LOW (ref 30.0–100.0)

## 2020-08-23 ENCOUNTER — Other Ambulatory Visit: Payer: Self-pay

## 2020-08-23 ENCOUNTER — Ambulatory Visit: Payer: BC Managed Care – PPO | Admitting: Family Medicine

## 2020-08-23 VITALS — BP 120/80 | HR 72 | Temp 98.0°F | Wt 142.2 lb

## 2020-08-23 DIAGNOSIS — M25572 Pain in left ankle and joints of left foot: Secondary | ICD-10-CM

## 2020-08-23 NOTE — Progress Notes (Signed)
   Subjective:    Patient ID: Barbara Roman, female    DOB: Oct 23, 1955, 65 y.o.   MRN: [id number]  HPI She apparently lifted something about a week ago and felt some discomfort in her right elbow but at this point in time it is no longer giving her trouble but she wants to make sure there is nothing wrong.  She also complains of a 2-week history of left ankle weakness but upon further questioning she is more concerned with the pain that that causes it to be weak.  No history of overuse or injury.   Review of Systems     Objective:   Physical Exam Exam of the right elbow shows full motion with no tenderness to palpation.  Normal strength. Left ankle exam shows full motion.  No laxity noted normal strength.  No tenderness palpation.       Assessment & Plan:  Acute left ankle pain Recommend supportive care with good motion of the ankle and also 2 Aleve twice per day for the next 10 days.  If continued difficulty she will return for reevaluation.

## 2020-08-31 ENCOUNTER — Other Ambulatory Visit: Payer: Self-pay | Admitting: Family Medicine

## 2020-08-31 DIAGNOSIS — Z1231 Encounter for screening mammogram for malignant neoplasm of breast: Secondary | ICD-10-CM

## 2020-10-25 ENCOUNTER — Other Ambulatory Visit: Payer: Self-pay

## 2020-10-25 ENCOUNTER — Ambulatory Visit
Admission: RE | Admit: 2020-10-25 | Discharge: 2020-10-25 | Disposition: A | Discharge status: Home / Self Care | Payer: BC Managed Care – PPO | Source: Ambulatory Visit | Attending: Family Medicine | Admitting: Family Medicine

## 2020-10-25 DIAGNOSIS — Z1231 Encounter for screening mammogram for malignant neoplasm of breast: Secondary | ICD-10-CM

## 2020-10-30 ENCOUNTER — Other Ambulatory Visit: Payer: Self-pay | Admitting: Family Medicine

## 2020-10-30 DIAGNOSIS — R928 Other abnormal and inconclusive findings on diagnostic imaging of breast: Secondary | ICD-10-CM

## 2020-11-01 ENCOUNTER — Ambulatory Visit: Payer: BC Managed Care – PPO | Admitting: Family Medicine

## 2020-11-01 ENCOUNTER — Encounter: Payer: Self-pay | Admitting: Family Medicine

## 2020-11-01 ENCOUNTER — Other Ambulatory Visit: Payer: Self-pay

## 2020-11-01 VITALS — BP 110/70 | HR 73 | Temp 97.9°F | Wt 143.2 lb

## 2020-11-01 DIAGNOSIS — M940 Chondrocostal junction syndrome [Tietze]: Secondary | ICD-10-CM | POA: Diagnosis not present

## 2020-11-01 NOTE — Progress Notes (Signed)
   Subjective:    Patient ID: Barbara Roman, female    DOB: 1956-01-31, 65 y.o.   MRN: [id number]  HPI She is here for evaluation of intermittent right-sided chest pain for the last year.  It tends to come and go but not related to any physical activity, eating or overuse.  No associated PND or DOE.  She does complain of some slight tenderness to palpation over the right anterior rib area near the costochondral junction.   Review of Systems     Objective:   Physical Exam Alert and in no distress.  Tender to palpation along the right second third and fourth costochondral junctions.  Lungs are clear to auscultation.  Cardiac exam shows regular rhythm without murmurs or gallops.       Assessment & Plan:  Costochondritis I explained in detail.  Explained that musculoskeletal not related to heart or lungs.  Recommend 2 Aleve twice per day for 10 days

## 2020-11-16 ENCOUNTER — Other Ambulatory Visit: Payer: BC Managed Care – PPO

## 2020-11-16 ENCOUNTER — Ambulatory Visit
Admission: RE | Admit: 2020-11-16 | Discharge: 2020-11-16 | Disposition: A | Discharge status: Home / Self Care | Payer: BC Managed Care – PPO | Source: Ambulatory Visit | Attending: Family Medicine | Admitting: Family Medicine

## 2020-11-16 ENCOUNTER — Other Ambulatory Visit: Payer: Self-pay

## 2020-11-16 ENCOUNTER — Ambulatory Visit: Payer: BC Managed Care – PPO

## 2020-11-16 DIAGNOSIS — R928 Other abnormal and inconclusive findings on diagnostic imaging of breast: Secondary | ICD-10-CM

## 2021-01-03 ENCOUNTER — Ambulatory Visit: Payer: BC Managed Care – PPO

## 2021-03-19 ENCOUNTER — Encounter: Payer: Self-pay | Admitting: Family Medicine

## 2021-03-19 ENCOUNTER — Other Ambulatory Visit: Payer: Self-pay

## 2021-03-19 ENCOUNTER — Ambulatory Visit: Payer: BC Managed Care – PPO | Admitting: Family Medicine

## 2021-03-19 VITALS — BP 120/74 | HR 72 | Temp 98.5°F | Wt 146.4 lb

## 2021-03-19 DIAGNOSIS — K121 Other forms of stomatitis: Secondary | ICD-10-CM

## 2021-03-19 DIAGNOSIS — Z23 Encounter for immunization: Secondary | ICD-10-CM | POA: Diagnosis not present

## 2021-03-19 NOTE — Progress Notes (Signed)
° °  Subjective:    Patient ID: Barbara Roman, female    DOB: 23-Jan-1956, 66 y.o.   MRN: [id number]  HPI She is here for evaluation of lesions in her mouth.  She notes some at the base of her bottom teeth that are slightly uncomfortable.  She also has an area of erythema on the right cheek. Review of the record also indicates she needs in the COVID-vaccine.  Review of Systems     Objective:   Physical Exam Exam of the mouth does show erythema on the right cheek otherwise no lesion was seen there.  She does have some whitish discoloration but not true ulceration on the mucosal surface near the gumline.       Assessment & Plan:  Mouth ulcers  Need for COVID-19 vaccine - Plan: Pension scheme manager I explained that I am not sure what these particular lesions really are in I explained that I am not sure what these lesions really are and recommend that she see her dentist for further evaluation of them. COVID-vaccine also given.

## 2021-03-20 ENCOUNTER — Telehealth: Payer: Self-pay

## 2021-03-20 ENCOUNTER — Other Ambulatory Visit: Payer: Self-pay

## 2021-03-20 NOTE — Telephone Encounter (Signed)
Pt was advised [facility] 

## 2021-03-20 NOTE — Telephone Encounter (Signed)
Pt. Called stating she just got a covid vaccine here yesterday and was seen about her eyes. She said Dr. Redmond School couldn't do anything for her eyes so she had to see her eye doctor they called in some medications for here. She wanted to check with you first about if it was ok for her to take these medication here eye doctor called in after getting a covid vaccine. I told her that it would be fine but she still wanted to talk to you about it at your earliest convince.

## 2021-04-04 ENCOUNTER — Telehealth: Payer: Self-pay

## 2021-04-04 NOTE — Telephone Encounter (Signed)
Pt was advised [facility] 

## 2021-04-04 NOTE — Telephone Encounter (Signed)
Pt called back concerning her mouth. She has a pulling sensation in the bottom of her jaw and redness on the inside of her jaw. Pt wants to know if she should see ENT or Dentist. Please advise [facility]

## 2021-06-06 ENCOUNTER — Encounter: Payer: Self-pay | Admitting: Family Medicine

## 2021-07-19 ENCOUNTER — Ambulatory Visit: Payer: Medicare PPO | Admitting: Family Medicine

## 2021-07-19 VITALS — BP 132/80 | HR 60 | Temp 96.8°F | Ht 62.0 in | Wt 150.8 lb

## 2021-07-19 DIAGNOSIS — E785 Hyperlipidemia, unspecified: Secondary | ICD-10-CM | POA: Diagnosis not present

## 2021-07-19 DIAGNOSIS — Z8639 Personal history of other endocrine, nutritional and metabolic disease: Secondary | ICD-10-CM

## 2021-07-19 DIAGNOSIS — Z0000 Encounter for general adult medical examination without abnormal findings: Secondary | ICD-10-CM | POA: Diagnosis not present

## 2021-07-19 DIAGNOSIS — Z1211 Encounter for screening for malignant neoplasm of colon: Secondary | ICD-10-CM

## 2021-07-19 DIAGNOSIS — Z833 Family history of diabetes mellitus: Secondary | ICD-10-CM

## 2021-07-19 DIAGNOSIS — Z23 Encounter for immunization: Secondary | ICD-10-CM

## 2021-07-19 NOTE — Progress Notes (Signed)
Barbara Roman is a 66 y.o. female who presents for annual wellness visit and follow-up on chronic medical conditions.  She has had some difficulty with pain in her hands as well as noting some mid foot pain when she gets up and walks but the symptom goes away very quickly.  She is on multiple over-the-counter medications.  Does have a previous history of vitamin D deficiency.  She does have a family history of diabetes as well as history of hyperlipidemia.  She is now retired and enjoying her retirement other than having to take care of her sister who is disabled.  Immunizations and Health Maintenance Immunization History  Administered Date(s) Administered   Influenza,inj,Quad PF,6+ Mos 11/16/2017, 11/23/2018   Influenza-Unspecified 11/21/2016, 12/18/2020   PFIZER(Purple Top)SARS-COV-2 Vaccination 05/24/2019, 06/16/2019, 01/26/2020   Pfizer Covid-19 Vaccine Bivalent Booster 71yr & up 03/19/2021   Tdap 10/05/2014   Zoster Recombinat (Shingrix) 11/21/2016, 01/23/2017   Health Maintenance Due  Topic Date Due   HIV Screening  Never done   Pneumonia Vaccine 65 Years old (1 - PCV) Never done    Last Pap smear: 10/18/2020 Last mammogram: 10/25/2020 Last colonoscopy: 02/25/2007 Dr. KDeatra Inacologuard : 08/04/2018 negative Last DEXA: 03/15/2015 Dentist:Q six months  Ophtho: Q year Exercise: n/a  Other doctors caring for patient include: Dr. HPhilis Piqueob/gyn              Advanced directives: Does Patient Have a Medical Advance Directive?: Yes Type of Advance Directive: Living will Does patient want to make changes to medical advance directive?: No - Patient declined  Depression screen:  See questionnaire below.     07/19/2021    9:31 AM 07/10/2020   10:46 AM 06/06/2020    3:33 PM 01/26/2020    3:29 PM 09/28/2019   11:27 AM  Depression screen PHQ 2/9  Decreased Interest 0 0 0 0 0  Down, Depressed, Hopeless 0 0 0 0 0  PHQ - 2 Score 0 0 0 0 0  Altered sleeping  0     Tired, decreased  energy  0     Change in appetite  0     Feeling bad or failure about yourself   0     Trouble concentrating  0     Moving slowly or fidgety/restless  0     Suicidal thoughts  0     PHQ-9 Score  0     Difficult doing work/chores  Not difficult at all       Fall Risk Screen: see questionnaire below.    07/19/2021    9:30 AM 07/10/2020   10:45 AM 06/06/2020    3:32 PM 10/05/2014    9:23 AM 03/07/2013    2:15 PM  Fall Risk   Falls in the past year? 0 1 1 No Yes  Number falls in past yr: 0 0 0  1  Injury with Fall? 0 1 0  No  Comment  right knee bruised left leg    Risk for fall due to : No Fall Risks Other (Comment) No Fall Risks    Follow up Falls evaluation completed Falls evaluation completed Falls evaluation completed      ADL screen:  See questionnaire below Functional Status Survey: Is the patient deaf or have difficulty hearing?: No Does the patient have difficulty seeing, even when wearing glasses/contacts?: No Does the patient have difficulty concentrating, remembering, or making decisions?: No Does the patient have difficulty walking or climbing stairs?: No Does the patient  have difficulty dressing or bathing?: No Does the patient have difficulty doing errands alone such as visiting a doctor's office or shopping?: No   Review of Systems Constitutional: -, -unexpected weight change, -anorexia, -fatigue Allergy: -sneezing, -itching, -congestion Dermatology: denies changing moles, rash, lumps ENT: -runny nose, -ear pain, -sore throat,  Cardiology:  -chest pain, -palpitations, -orthopnea, Respiratory: -cough, -shortness of breath, -dyspnea on exertion, -wheezing,  Gastroenterology: -abdominal pain, -nausea, -vomiting, -diarrhea, -constipation, -dysphagia Hematology: -bleeding or bruising problems Musculoskeletal: -arthralgias, -myalgias, -joint swelling, -back pain, - Ophthalmology: -vision changes,  Urology: -dysuria, -difficulty urinating,  -urinary frequency, -urgency,  incontinence Neurology: -, -numbness, , -memory loss, -falls, -dizziness    PHYSICAL EXAM:  LMP 07/29/1997   General Appearance: Alert, cooperative, no distress, appears stated age Head: Normocephalic, without obvious abnormality, atraumatic Eyes: PERRL, conjunctiva/corneas clear, EOM's intact, fundi benign Ears: Normal TM's and external ear canals Nose: Nares normal, mucosa normal, no drainage or sinus tenderness Throat: Lips, mucosa, and tongue normal; teeth and gums normal Neck: Supple, no lymphadenopathy;  thyroid:  no enlargement/tenderness/nodules; no carotid bruit or JVD Lungs: Clear to auscultation bilaterally without wheezes, rales or ronchi; respirations unlabored Heart: Regular rate and rhythm, S1 and S2 normal, no murmur, rubor gallop Abdomen: Soft, non-tender, nondistended, normoactive bowel sounds,  no masses, no hepatosplenomegaly Skin:  Skin color, texture, turgor normal, no rashes or lesions Lymph nodes: Cervical, supraclavicular, and axillary nodes normal Neurologic:  CNII-XII intact, normal strength, sensation and gait; reflexes 2+ and symmetric throughout Psych: Normal mood, affect, hygiene and grooming.  ASSESSMENT/PLAN: Hyperlipidemia, unspecified hyperlipidemia type  Family history of diabetes mellitus - Plan: CBC with Differential/Platelet, Comprehensive metabolic panel, Lipid panel  H/O vitamin D deficiency - Plan: VITAMIN D 25 Hydroxy (Vit-D Deficiency, Fractures)  Need for pneumococcal vaccination - Plan: Pneumococcal conjugate vaccine 20-valent  Screening for colon cancer - Plan: Cologuard    Discussed   Immunization recommendations discussed.  Colonoscopy recommendations reviewed   Medicare Attestation I have personally reviewed: The patient's medical and social history Their use of alcohol, tobacco or illicit drugs Their current medications and supplements The patient's functional ability including ADLs,fall risks, home safety risks,  cognitive, and hearing and visual impairment Diet and physical activities Evidence for depression or mood disorders  The patient's weight, height, and BMI have been recorded in the chart.  I have made referrals, counseling, and provided education to the patient based on review of the above and I have provided the patient with a written personalized care plan for preventive services.     Jill Alexanders, MD   07/19/2021

## 2021-07-20 LAB — CBC WITH DIFFERENTIAL/PLATELET
Basophils Absolute: 0 10*3/uL (ref 0.0–0.2)
Basos: 0 %
EOS (ABSOLUTE): 0.1 10*3/uL (ref 0.0–0.4)
Eos: 2 %
Hematocrit: 44 % (ref 34.0–46.6)
Hemoglobin: 14.3 g/dL (ref 11.1–15.9)
Immature Grans (Abs): 0 10*3/uL (ref 0.0–0.1)
Immature Granulocytes: 0 %
Lymphocytes Absolute: 1.7 10*3/uL (ref 0.7–3.1)
Lymphs: 35 %
MCH: 24.6 pg — ABNORMAL LOW (ref 26.6–33.0)
MCHC: 32.5 g/dL (ref 31.5–35.7)
MCV: 76 fL — ABNORMAL LOW (ref 79–97)
Monocytes Absolute: 0.6 10*3/uL (ref 0.1–0.9)
Monocytes: 12 %
Neutrophils Absolute: 2.6 10*3/uL (ref 1.4–7.0)
Neutrophils: 51 %
Platelets: 301 10*3/uL (ref 150–450)
RBC: 5.82 x10E6/uL — ABNORMAL HIGH (ref 3.77–5.28)
RDW: 14.1 % (ref 11.7–15.4)
WBC: 5 10*3/uL (ref 3.4–10.8)

## 2021-07-20 LAB — LIPID PANEL
Chol/HDL Ratio: 4.4 ratio (ref 0.0–4.4)
Cholesterol, Total: 270 mg/dL — ABNORMAL HIGH (ref 100–199)
HDL: 61 mg/dL (ref >39)
LDL Chol Calc (NIH): 188 mg/dL — ABNORMAL HIGH (ref 0–99)
Triglycerides: 117 mg/dL (ref 0–149)
VLDL Cholesterol Cal: 21 mg/dL (ref 5–40)

## 2021-07-20 LAB — COMPREHENSIVE METABOLIC PANEL
ALT: 28 IU/L (ref 0–32)
AST: 28 IU/L (ref 0–40)
Albumin/Globulin Ratio: 1.5 (ref 1.2–2.2)
Albumin: 4.7 g/dL (ref 3.8–4.8)
Alkaline Phosphatase: 123 IU/L — ABNORMAL HIGH (ref 44–121)
BUN/Creatinine Ratio: 15 (ref 12–28)
BUN: 12 mg/dL (ref 8–27)
Bilirubin Total: 0.4 mg/dL (ref 0.0–1.2)
CO2: 21 mmol/L (ref 20–29)
Calcium: 10.2 mg/dL (ref 8.7–10.3)
Chloride: 103 mmol/L (ref 96–106)
Creatinine, Ser: 0.82 mg/dL (ref 0.57–1.00)
Globulin, Total: 3.2 g/dL (ref 1.5–4.5)
Glucose: 95 mg/dL (ref 70–99)
Potassium: 4.1 mmol/L (ref 3.5–5.2)
Sodium: 138 mmol/L (ref 134–144)
Total Protein: 7.9 g/dL (ref 6.0–8.5)
eGFR: 79 mL/min/{1.73_m2} (ref >59)

## 2021-07-20 LAB — VITAMIN D 25 HYDROXY (VIT D DEFICIENCY, FRACTURES): Vit D, 25-Hydroxy: 23.4 ng/mL — ABNORMAL LOW (ref 30.0–100.0)

## 2021-08-21 LAB — COLOGUARD: COLOGUARD: NEGATIVE

## 2021-08-22 ENCOUNTER — Encounter: Payer: Self-pay | Admitting: Family Medicine

## 2021-10-08 ENCOUNTER — Other Ambulatory Visit: Payer: Self-pay | Admitting: Family Medicine

## 2021-10-08 DIAGNOSIS — Z1231 Encounter for screening mammogram for malignant neoplasm of breast: Secondary | ICD-10-CM

## 2021-10-22 DIAGNOSIS — Z01419 Encounter for gynecological examination (general) (routine) without abnormal findings: Secondary | ICD-10-CM | POA: Diagnosis not present

## 2021-10-23 ENCOUNTER — Encounter: Payer: Self-pay | Admitting: Internal Medicine

## 2021-10-25 DIAGNOSIS — H00024 Hordeolum internum left upper eyelid: Secondary | ICD-10-CM | POA: Diagnosis not present

## 2021-11-01 DIAGNOSIS — H1013 Acute atopic conjunctivitis, bilateral: Secondary | ICD-10-CM | POA: Diagnosis not present

## 2021-11-18 ENCOUNTER — Ambulatory Visit: Payer: BC Managed Care – PPO

## 2021-11-18 ENCOUNTER — Ambulatory Visit
Admission: RE | Admit: 2021-11-18 | Discharge: 2021-11-18 | Disposition: A | Discharge status: Home / Self Care | Payer: Medicare PPO | Source: Ambulatory Visit | Attending: Family Medicine | Admitting: Family Medicine

## 2021-11-18 DIAGNOSIS — Z1231 Encounter for screening mammogram for malignant neoplasm of breast: Secondary | ICD-10-CM | POA: Diagnosis not present

## 2021-11-26 ENCOUNTER — Encounter: Payer: Self-pay | Admitting: Internal Medicine

## 2021-12-09 ENCOUNTER — Encounter: Payer: Self-pay | Admitting: Internal Medicine

## 2021-12-09 ENCOUNTER — Other Ambulatory Visit (INDEPENDENT_AMBULATORY_CARE_PROVIDER_SITE_OTHER): Payer: Medicare PPO

## 2021-12-09 DIAGNOSIS — Z23 Encounter for immunization: Secondary | ICD-10-CM

## 2021-12-30 DIAGNOSIS — L237 Allergic contact dermatitis due to plants, except food: Secondary | ICD-10-CM | POA: Diagnosis not present

## 2022-03-05 ENCOUNTER — Encounter: Payer: Self-pay | Admitting: Family Medicine

## 2022-03-05 ENCOUNTER — Ambulatory Visit: Payer: Medicare PPO | Admitting: Family Medicine

## 2022-03-05 VITALS — BP 138/76 | HR 80 | Temp 97.9°F | Wt 149.6 lb

## 2022-03-05 DIAGNOSIS — R201 Hypoesthesia of skin: Secondary | ICD-10-CM | POA: Diagnosis not present

## 2022-03-05 DIAGNOSIS — G8929 Other chronic pain: Secondary | ICD-10-CM | POA: Diagnosis not present

## 2022-03-05 DIAGNOSIS — M722 Plantar fascial fibromatosis: Secondary | ICD-10-CM

## 2022-03-05 DIAGNOSIS — M545 Low back pain, unspecified: Secondary | ICD-10-CM | POA: Diagnosis not present

## 2022-03-05 NOTE — Progress Notes (Signed)
   Subjective:    Patient ID: Barbara Roman, female    DOB: Sep 23, 1955, 67 y.o.   MRN: [id number]  HPI She is here for evaluation of multiple issues.  She has a history of intermittent low back pain but no numbness, tingling, weakness or referred pain.  She has been taking intermittent doses of OTC medications.  She also complains of lower abdominal strain that apparently started after she was helping a friend put on hospital grade stockings.  She felt some lower abdominal strain.  She also complains of left lateral thigh numbness.  No history of injury to that area.  She points to the area of the mid lateral thigh area. She also has a 75-monthhistory of difficulty with left heel pain that bothers her when she gets up.  When she is up walking around it causes less trouble but if she sits for a long period of time it will recur.   Review of Systems     Objective:   Physical Exam Alert and in no distress.  Exam of her back shows no palpable tenderness with some discomfort with motion in any direction.  Negative straight leg raising and normal DTRs.  Normal strength.  She does complain of decreased sensation to the left lateral mid thigh area.  Hip motion is normal.  Knee motion is normal. Exam of the left heel does show some tenderness over the midportion of the calcaneus slightly posterior to the calcaneal spur.  Full motion of the ankle and forefoot. Abdominal exam shows some slight discomfort in the suprapubic area that was worsened when she flexed her abdominal muscles.      Assessment & Plan:  Need for COVID-19 vaccine - Plan: [place] 2023 Covid-19 Vaccine 16yrand older  Chronic midline low back pain without sciatica - Plan: Ambulatory referral to Physical Therapy  Plantar fasciitis of left foot  Hypoesthesia of skin I will send her to physical therapy to help with getting her on a good rehab program for her back. I explained that I thought the abdominal symptoms are  really strain of the lower abdominal musculature and conservative care would be appropriate. Discussed the lateral hypnesthesia and at this point I see no indication of any pathology and recommended watchful waiting. Instructions were given for her plantar fasciitis in terms of heel cord stretching, use of an orthotic that she already has and if that is not successful to use heel cups.  She will keep me informed and I explained that this could take a month or 2 before any improvement.

## 2022-03-05 NOTE — Patient Instructions (Addendum)
Plantar Fasciitis  Plantar fasciitis is a painful foot condition that affects the heel. It occurs when the band of tissue that connects the toes to the heel bone (plantar fascia) becomes irritated. This can happen as the result of exercising too much or doing other repetitive activities (overuse injury). Plantar fasciitis can cause mild irritation to severe pain that makes it difficult to walk or move. The pain is usually worse in the morning after sleeping, or after sitting or lying down for a period of time. Pain may also be worse after long periods of walking or standing. What are the causes? This condition may be caused by: Standing for long periods of time. Wearing shoes that do not have good arch support. Doing activities that put stress on joints (high-impact activities). This includes ballet and exercise that makes your heart beat faster (aerobic exercise), such as running. Being overweight. An abnormal way of walking (gait). Tight muscles in the back of your lower leg (calf). High arches in your feet or flat feet. Starting a new athletic activity. What are the signs or symptoms? The main symptom of this condition is heel pain. Pain may get worse after the following: Taking the first steps after a time of rest, especially in the morning after awakening, or after you have been sitting or lying down for a while. Long periods of standing still. Pain may decrease after 30-45 minutes of activity, such as gentle walking. How is this diagnosed? This condition may be diagnosed based on your medical history, a physical exam, and your symptoms. Your health care provider will check for: A tender area on the bottom of your foot. A high arch in your foot or flat feet. Pain when you move your foot. Difficulty moving your foot. You may have imaging tests to confirm the diagnosis, such as: X-rays. Ultrasound. MRI. How is this treated? Treatment for plantar fasciitis depends on how severe your  condition is. Treatment may include: Rest, ice, pressure (compression), and raising (elevating) the affected foot. This is called RICE therapy. Your health care provider may recommend RICE therapy along with over-the-counter pain medicines to manage your pain. Exercises to stretch your calves and your plantar fascia. A splint that holds your foot in a stretched, upward position while you sleep (night splint). Physical therapy to relieve symptoms and prevent problems in the future. Injections of steroid medicine (cortisone) to relieve pain and inflammation. Stimulating your plantar fascia with electrical impulses (extracorporeal shock wave therapy). This is usually the last treatment option before surgery. Surgery, if other treatments have not worked after 12 months. Follow these instructions at home: Managing pain, stiffness, and swelling  If directed, put ice on the painful area. To do this: Put ice in a plastic bag, or use a frozen bottle of water. Place a towel between your skin and the bag or bottle. Roll the bottom of your foot over the bag or bottle. Do this for 20 minutes, 2-3 times a day. Wear athletic shoes that have air-sole or gel-sole cushions, or try soft shoe inserts that are designed for plantar fasciitis. Elevate your foot above the level of your heart while you are sitting or lying down. Activity Avoid activities that cause pain. Ask your health care provider what activities are safe for you. Do physical therapy exercises and stretches as told by your health care provider. Try activities and forms of exercise that are easier on your joints (low impact). Examples include swimming, water aerobics, and biking. General instructions Take over-the-counter   and prescription medicines only as told by your health care provider. Wear a night splint while sleeping, if told by your health care provider. Loosen the splint if your toes tingle, become numb, or turn cold and blue. Maintain a  healthy weight, or work with your health care provider to lose weight as needed. Keep all follow-up visits. This is important. Contact a health care provider if you have: Symptoms that do not go away with home treatment. Pain that gets worse. Pain that affects your ability to move or do daily activities. Summary Plantar fasciitis is a painful foot condition that affects the heel. It occurs when the band of tissue that connects the toes to the heel bone (plantar fascia) becomes irritated. Heel pain is the main symptom of this condition. It may get worse after exercising too much or standing still for a long time. Treatment varies, but it usually starts with rest, ice, pressure (compression), and raising (elevating) the affected foot. This is called RICE therapy. Over-the-counter medicines can also be used to manage pain. This information is not intended to replace advice given to you by your health care provider. Make sure you discuss any questions you have with your health care provider. Document Revised: 05/23/2019 Document Reviewed: 05/23/2019 Elsevier Patient Education  [place] or Strain Rehab Ask your health care provider which exercises are safe for you. Do exercises exactly as told by your health care provider and adjust them as directed. It is normal to feel mild stretching, pulling, tightness, or discomfort as you do these exercises. Stop right away if you feel sudden pain or your pain gets worse. Do not begin these exercises until told by your health care provider. Stretching and range-of-motion exercises These exercises warm up your muscles and joints and improve the movement and flexibility of your back. These exercises also help to relieve pain, numbness, and tingling. Lumbar rotation  Lie on your back on a firm bed or the floor with your knees bent. Straighten your arms out to your sides so each arm forms a 90-degree angle (right angle)  with a side of your body. Slowly move (rotate) both of your knees to one side of your body until you feel a stretch in your lower back (lumbar). Try not to let your shoulders lift off the floor. Hold this position for __________ seconds. Tense your abdominal muscles and slowly move your knees back to the starting position. Repeat this exercise on the other side of your body. Repeat __________ times. Complete this exercise __________ times a day. Single knee to chest  Lie on your back on a firm bed or the floor with both legs straight. Bend one of your knees. Use your hands to move your knee up toward your chest until you feel a gentle stretch in your lower back and buttock. Hold your leg in this position by holding on to the front of your knee. Keep your other leg as straight as possible. Hold this position for __________ seconds. Slowly return to the starting position. Repeat with your other leg. Repeat __________ times. Complete this exercise __________ times a day. Prone extension on elbows  Lie on your abdomen on a firm bed or the floor (prone position). Prop yourself up on your elbows. Use your arms to help lift your chest up until you feel a gentle stretch in your abdomen and your lower back. This will place some of your body weight on your elbows. If this  is uncomfortable, try stacking pillows under your chest. Your hips should stay down, against the surface that you are lying on. Keep your hip and back muscles relaxed. Hold this position for __________ seconds. Slowly relax your upper body and return to the starting position. Repeat __________ times. Complete this exercise __________ times a day. Strengthening exercises These exercises build strength and endurance in your back. Endurance is the ability to use your muscles for a long time, even after they get tired. Pelvic tilt This exercise strengthens the muscles that lie deep in the abdomen. Lie on your back on a firm bed or  the floor with your legs extended. Bend your knees so they are pointing toward the ceiling and your feet are flat on the floor. Tighten your lower abdominal muscles to press your lower back against the floor. This motion will tilt your pelvis so your tailbone points up toward the ceiling instead of pointing to your feet or the floor. To help with this exercise, you may place a small towel under your lower back and try to push your back into the towel. Hold this position for __________ seconds. Let your muscles relax completely before you repeat this exercise. Repeat __________ times. Complete this exercise __________ times a day. Alternating arm and leg raises  Get on your hands and knees on a firm surface. If you are on a hard floor, you may want to use padding, such as an exercise mat, to cushion your knees. Line up your arms and legs. Your hands should be directly below your shoulders, and your knees should be directly below your hips. Lift your left leg behind you. At the same time, raise your right arm and straighten it in front of you. Do not lift your leg higher than your hip. Do not lift your arm higher than your shoulder. Keep your abdominal and back muscles tight. Keep your hips facing the ground. Do not arch your back. Keep your balance carefully, and do not hold your breath. Hold this position for __________ seconds. Slowly return to the starting position. Repeat with your right leg and your left arm. Repeat __________ times. Complete this exercise __________ times a day. Abdominal set with straight leg raise  Lie on your back on a firm bed or the floor. Bend one of your knees and keep your other leg straight. Tense your abdominal muscles and lift your straight leg up, 4-6 inches (10-15 cm) off the ground. Keep your abdominal muscles tight and hold this position for __________ seconds. Do not hold your breath. Do not arch your back. Keep it flat against the ground. Keep your  abdominal muscles tense as you slowly lower your leg back to the starting position. Repeat with your other leg. Repeat __________ times. Complete this exercise __________ times a day. Single leg lower with bent knees Lie on your back on a firm bed or the floor. Tense your abdominal muscles and lift your feet off the floor, one foot at a time, so your knees and hips are bent in 90-degree angles (right angles). Your knees should be over your hips and your lower legs should be parallel to the floor. Keeping your abdominal muscles tense and your knee bent, slowly lower one of your legs so your toe touches the ground. Lift your leg back up to return to the starting position. Do not hold your breath. Do not let your back arch. Keep your back flat against the ground. Repeat with your other leg. Repeat __________  times. Complete this exercise __________ times a day. Posture and body mechanics Good posture and healthy body mechanics can help to relieve stress in your body's tissues and joints. Body mechanics refers to the movements and positions of your body while you do your daily activities. Posture is part of body mechanics. Good posture means: Your spine is in its natural S-curve position (neutral). Your shoulders are pulled back slightly. Your head is not tipped forward (neutral). Follow these guidelines to improve your posture and body mechanics in your everyday activities. Standing  When standing, keep your spine neutral and your feet about hip-width apart. Keep a slight bend in your knees. Your ears, shoulders, and hips should line up. When you do a task in which you stand in one place for a long time, place one foot up on a stable object that is 2-4 inches (5-10 cm) high, such as a footstool. This helps keep your spine neutral. Sitting  When sitting, keep your spine neutral and keep your feet flat on the floor. Use a footrest, if necessary, and keep your thighs parallel to the floor. Avoid  rounding your shoulders, and avoid tilting your head forward. When working at a desk or a computer, keep your desk at a height where your hands are slightly lower than your elbows. Slide your chair under your desk so you are close enough to maintain good posture. When working at a computer, place your monitor at a height where you are looking straight ahead and you do not have to tilt your head forward or downward to look at the screen. Resting When lying down and resting, avoid positions that are most painful for you. If you have pain with activities such as sitting, bending, stooping, or squatting, lie in a position in which your body does not bend very much. For example, avoid curling up on your side with your arms and knees near your chest (fetal position). If you have pain with activities such as standing for a long time or reaching with your arms, lie with your spine in a neutral position and bend your knees slightly. Try the following positions: Lying on your side with a pillow between your knees. Lying on your back with a pillow under your knees. Lifting  When lifting objects, keep your feet at least shoulder-width apart and tighten your abdominal muscles. Bend your knees and hips and keep your spine neutral. It is important to lift using the strength of your legs, not your back. Do not lock your knees straight out. Always ask for help to lift heavy or awkward objects. This information is not intended to replace advice given to you by your health care provider. Make sure you discuss any questions you have with your health care provider. Document Revised: 04/23/2020 Document Reviewed: 04/23/2020 Elsevier Patient Education  [place].

## 2022-03-14 ENCOUNTER — Ambulatory Visit: Payer: Medicare PPO | Admitting: Rehabilitative and Restorative Service Providers"

## 2022-03-14 ENCOUNTER — Ambulatory Visit: Payer: Medicare PPO | Admitting: Physical Therapy

## 2022-03-19 NOTE — Therapy (Signed)
OUTPATIENT PHYSICAL THERAPY EVALUATION   Patient Name: Barbara Roman MRN: [id number] DOB:1955-10-28, 67 y.o., female Today's Date: 03/21/2022  END OF SESSION:  PT End of Session - 03/21/22 1022     Visit Number 1    Number of Visits 24    Date for PT Re-Evaluation 06/13/22    Authorization Type [employer] $20 copay    Authorization - Visit Number 1    Authorization - Number of Visits 12    PT Start Time 5993    PT Stop Time 1055    PT Time Calculation (min) 27 min    Activity Tolerance Patient tolerated treatment well    Behavior During Therapy WFL for tasks assessed/performed             Past Medical History:  Diagnosis Date   Cervical dysplasia    Dysmenorrhea    Fibroid    Hyperlipidemia    Vitamin D deficiency    Past Surgical History:  Procedure Laterality Date   COLPOSCOPY     FOOT SURGERY     TOTAL VAGINAL HYSTERECTOMY  02/17/1997   TUBAL LIGATION     Patient Active Problem List   Diagnosis Date Noted   Hyperlipidemia 07/06/2018   H/O vitamin D deficiency 12/24/2015   Family history of diabetes mellitus 11/12/2012    PCP: Denita Lung, MD  REFERRING PROVIDER: Denita Lung, MD  REFERRING DIAG: M54.50,G89.29 (ICD-10-CM) - Chronic midline low back pain without sciatica  Rationale for Evaluation and Treatment: Rehabilitation  THERAPY DIAG:  Other low back pain  Pain in left foot  Muscle weakness (generalized)  Difficulty in walking, not elsewhere classified  ONSET DATE: Over 6 months  SUBJECTIVE:                                                                                                                                                                                           SUBJECTIVE STATEMENT: She indicated complaints across lower back and into bilateral groin at times.  Also mentioned some complaints of Lt foot pain.   She indicated she was working in the yard and doing mulch lifting and carrying over 6 months ago and she  noted having increased trouble.   She indicated bending, lifting, carrying and initial mobility was troublesome.     She indicated a fall a couple of years ago and hit Lt knee and has had some numbness proximal to Lt knee.   Constant numbness was noted.   Foot complaint in heel on Lt with trouble walking initially but better with some walking.   PERTINENT HISTORY:  Concurrent plantar fasciitis per MD note.  Hyperlipidemia  PAIN:  NPRS scale: back at worst 8/10 at current 4/10.  Foot at worst 8/10, 2/10 Pain location: back, groin bilateral, Lt foot Pain description: sharp,  Aggravating factors: lifting, carrying, bending over, getting up from bending over, initial mobility after inactivity Relieving factors: general movement helps, heating pad, icy hot  PRECAUTIONS: None  WEIGHT BEARING RESTRICTIONS: No  FALLS:  Has patient fallen in last 6 months? No  LIVING ENVIRONMENT: Lives in: House/apartment  OCCUPATION: Retired  PLOF: Independent, yard work, Environmental consultant  PATIENT GOALS: Reduce pain.    OBJECTIVE:   PATIENT SURVEYS:  03/21/2022 FOTO eval:  70  predicted:  75  SCREENING FOR RED FLAGS: 03/21/2022 Bowel or bladder incontinence: No Cauda equina syndrome: No  COGNITION: 03/21/2022 Overall cognitive status: WFL normal      SENSATION: 03/21/2022 No specific limitation to light touch  MUSCLE LENGTH: 03/21/2022 Unremarkable   POSTURE: 03/21/2022  Mild lateral shift trunk to Lt but easily corrected and gone for the rest of the visit.   PALPATION: 03/21/2022 No specific tenderness in lumbar region to light touch today  LUMBAR ROM:   AROM 03/21/2022  Flexion Movement to mid shin c pain noted in lumbar  Extension 75% WFL with no specific pain complaints  Repeated x 5 in standing : improved to 100 % c   Right lateral flexion Pain in Rt lumbar  Left lateral flexion Pain in Rt lumbar  Right rotation   Left rotation    (Blank rows = not tested)  LOWER EXTREMITY ROM:       Right 03/21/2022 Left 03/21/2022  Hip flexion    Hip extension    Hip abduction    Hip adduction    Hip internal rotation    Hip external rotation    Knee flexion    Knee extension    Ankle dorsiflexion    Ankle plantarflexion    Ankle inversion    Ankle eversion     (Blank rows = not tested)  MMT:    MMT Right 03/21/2022 Left 03/21/2022  Hip flexion 5/5 5/5  Hip extension    Hip abduction    Hip adduction    Hip internal rotation    Hip external rotation    Knee flexion 5/5 5/5  Knee extension 5/5 5/5  Ankle dorsiflexion 5/5 5/5  Ankle plantarflexion Check in future 2+/5 (pain in WB PF)  Ankle inversion    Ankle eversion     (Blank rows = not tested)  SPECIAL TESTS:  03/21/2022 (-) Slump bilateral  FUNCTIONAL TESTS:  03/21/2022 18 inch chair s UE on 1st   GAIT: 03/21/2022 Independent ambulation  TODAY'S TREATMENT:                                                                                                      DATE: 03/21/2022  Therex:    HEP instruction/performance c cues for techniques, handout provided.  Trial set performed of each for comprehension and symptom assessment.  See below for exercise list  PATIENT EDUCATION:  03/21/2022 Education details: HEP, POC Person educated:  Patient Education method: Explanation, Demonstration, Verbal cues, and Handouts Education comprehension: verbalized understanding, returned demonstration, and verbal cues required  HOME EXERCISE PROGRAM: Access Code: [facility] URL: [email or web address] Date: 03/21/2022 Prepared by: Scot Jun  Exercises - Supine Bridge  - 1-2 x daily - 7 x weekly - 1-2 sets - 10 reps - 2 hold - Supine Lower Trunk Rotation  - 2-3 x daily - 7 x weekly - 1 sets - 3-5 reps - 15 hold - Standing Lumbar Extension with Counter  - 3-5 x daily - 7 x weekly - 1 sets - 5-10 reps - Supine Piriformis Stretch Pulling Heel to Hip (Mirrored)  - 2-3 x daily - 7 x weekly - 1 sets - 5 reps - 15-30 hold -  Gastroc Stretch on Wall  - 2 x daily - 7 x weekly - 1 sets - 5 reps - 30 hold  ASSESSMENT:  CLINICAL IMPRESSION: Patient is a 67 y.o. who comes to clinic with complaints of low back pain, Lt foot pain with mobility, strength and movement coordination deficits that impair their ability to perform usual daily and recreational functional activities without increase difficulty/symptoms at this time.  Patient to benefit from skilled PT services to address impairments and limitations to improve to previous level of function without restriction secondary to condition.   OBJECTIVE IMPAIRMENTS: decreased activity tolerance, decreased coordination, decreased endurance, decreased mobility, difficulty walking, decreased ROM, decreased strength, impaired perceived functional ability, impaired flexibility, improper body mechanics, and pain.   ACTIVITY LIMITATIONS: carrying, lifting, bending, sitting, standing, squatting, transfers, bed mobility, and locomotion level  PARTICIPATION LIMITATIONS: cleaning, laundry, interpersonal relationship, shopping, community activity, and yard work  PERSONAL FACTORS: Time since onset of injury/illness/exacerbation are also affecting patient's functional outcome.   REHAB POTENTIAL: Good  CLINICAL DECISION MAKING: Stable/uncomplicated  EVALUATION COMPLEXITY: Low   GOALS: Goals reviewed with patient? Yes  SHORT TERM GOALS: (target date for Short term goals are 3 weeks 04/11/2022)  1. Patient will demonstrate independent use of home exercise program to maintain progress from in clinic treatments.  Goal status: New  LONG TERM GOALS: (target dates for all long term goals are 10 weeks  05/30/2022 )   1. Patient will demonstrate/report pain at worst less than or equal to 2/10 to facilitate minimal limitation in daily activity secondary to pain symptoms.  Goal status: New   2. Patient will demonstrate independent use of home exercise program to facilitate ability to  maintain/progress functional gains from skilled physical therapy services.  Goal status: New   3. Patient will demonstrate FOTO outcome > or = 75 % to indicate reduced disability due to condition.  Goal status: New   4. Patient will demonstrate lumbar extension 100 % WFL s symptoms to facilitate upright standing, walking posture at PLOF s limitation.  Goal status: New   5.  Patient will demonstrate lateral flexion lumbar s symptoms to facilitate mobility at PLOF.   Goal status: New   6.  Patient will demonstrate/report ability to perform usual housework/laundry at [facility] s limitation.  Goal status: New   7.  Patient will demonstrate/report ability to perform ambulation s restriction due to symptoms.  Goal Status: New  PLAN:  PT FREQUENCY: 1-2x/week  PT DURATION: 10 weeks  PLANNED INTERVENTIONS: Therapeutic exercises, Therapeutic activity, Neuro Muscular re-education, Balance training, Gait training, Patient/Family education, Joint mobilization, Stair training, DME instructions, Dry Needling, Electrical stimulation, Cryotherapy, vasopneumatic device, Moist heat, Taping, Traction Ultrasound, Ionotophoresis '4mg'$ /ml Dexamethasone, and Manual therapy.  All included unless  contraindicated  PLAN FOR NEXT SESSION: Review HEP knowledge/results.   Lumbar mobility improvements, early strengthening for postural support.    Scot Jun, PT, DPT, OCS, ATC 03/21/22  11:21 AM    Referring diagnosis? M54.50,G89.29 (ICD-10-CM) - Chronic midline low back pain without sciatica Treatment diagnosis? (if different than referring diagnosis) M54.59 What was this (referring dx) caused by? '[]'$  Surgery '[]'$  Fall '[x]'$  Ongoing issue '[]'$  Arthritis '[]'$  Other: ____________  Laterality: '[]'$  Rt '[]'$  Lt '[x]'$  Both  Check all possible CPT codes:  *CHOOSE 10 OR LESS*    '[]'$  97110 (Therapeutic Exercise)  '[]'$  92507 (SLP Treatment)  '[]'$  97112 (Neuro Re-ed)   '[]'$  92526 (Swallowing Treatment)   '[]'$  97116 (Gait  Training)   '[]'$  [id number] (Cognitive Training, 1st 15 minutes) '[]'$  97140 (Manual Therapy)   '[]'$  97130 (Cognitive Training, each add'l 15 minutes)  '[]'$  97164 (Re-evaluation)                              '[]'$  Other, List CPT Code ____________  '[]'$  54627 (Therapeutic Activities)     '[]'$  97535 (Self Care)   '[x]'$  All codes above (97110 - 97535)  '[]'$  97012 (Mechanical Traction)  '[x]'$  97014 (E-stim Unattended)  '[]'$  97032 (E-stim manual)  '[]'$  97033 (Ionto)  '[]'$  97035 (Ultrasound) '[x]'$  97750 (Physical Performance Training) '[]'$  [id number] (Aquatic Therapy) '[]'$  97016 (Vasopneumatic Device) '[]'$  [id number] (Paraffin) '[]'$  97034 (Contrast Bath) '[]'$  97597 (Wound Care 1st 20 sq cm) '[]'$  97598 (Wound Care each add'l 20 sq cm) '[]'$  97760 (Orthotic Fabrication, Fitting, Training Initial) '[]'$  [id number] (Prosthetic Management and Training Initial) '[]'$  [id number] (Orthotic or Prosthetic Training/ Modification Subsequent)

## 2022-03-21 ENCOUNTER — Other Ambulatory Visit: Payer: Self-pay

## 2022-03-21 ENCOUNTER — Ambulatory Visit: Payer: Medicare PPO | Admitting: Rehabilitative and Restorative Service Providers"

## 2022-03-21 ENCOUNTER — Encounter: Payer: Self-pay | Admitting: Rehabilitative and Restorative Service Providers"

## 2022-03-21 DIAGNOSIS — M5459 Other low back pain: Secondary | ICD-10-CM

## 2022-03-21 DIAGNOSIS — M6281 Muscle weakness (generalized): Secondary | ICD-10-CM | POA: Diagnosis not present

## 2022-03-21 DIAGNOSIS — R262 Difficulty in walking, not elsewhere classified: Secondary | ICD-10-CM | POA: Diagnosis not present

## 2022-03-21 DIAGNOSIS — M79672 Pain in left foot: Secondary | ICD-10-CM

## 2022-03-27 ENCOUNTER — Ambulatory Visit: Payer: Medicare PPO | Admitting: Rehabilitative and Restorative Service Providers"

## 2022-03-27 ENCOUNTER — Encounter: Payer: Self-pay | Admitting: Rehabilitative and Restorative Service Providers"

## 2022-03-27 DIAGNOSIS — M79672 Pain in left foot: Secondary | ICD-10-CM | POA: Diagnosis not present

## 2022-03-27 DIAGNOSIS — R262 Difficulty in walking, not elsewhere classified: Secondary | ICD-10-CM

## 2022-03-27 DIAGNOSIS — M5459 Other low back pain: Secondary | ICD-10-CM

## 2022-03-27 DIAGNOSIS — M6281 Muscle weakness (generalized): Secondary | ICD-10-CM | POA: Diagnosis not present

## 2022-03-27 NOTE — Therapy (Signed)
OUTPATIENT PHYSICAL THERAPY TREATMENT   Patient Name: Barbara Roman MRN: [id number] DOB:[birth year], 67 y.o., female Today's Date: 03/27/2022  END OF SESSION:  PT End of Session - 03/27/22 1422     Visit Number 2    Number of Visits 24    Date for PT Re-Evaluation 06/13/22    Authorization Type [employer] $20 copay    Authorization Time Period 03/21/2022-06/13/2022    Authorization - Visit Number 2    Authorization - Number of Visits 12    PT Start Time 0347    PT Stop Time 1504    PT Time Calculation (min) 39 min    Activity Tolerance Patient tolerated treatment well    Behavior During Therapy WFL for tasks assessed/performed              Past Medical History:  Diagnosis Date   Cervical dysplasia    Dysmenorrhea    Fibroid    Hyperlipidemia    Vitamin D deficiency    Past Surgical History:  Procedure Laterality Date   COLPOSCOPY     FOOT SURGERY     TOTAL VAGINAL HYSTERECTOMY  02/17/1997   TUBAL LIGATION     Patient Active Problem List   Diagnosis Date Noted   Hyperlipidemia 07/06/2018   H/O vitamin D deficiency 12/24/2015   Family history of diabetes mellitus 11/12/2012    PCP: Denita Lung, MD  REFERRING PROVIDER: Denita Lung, MD  REFERRING DIAG: M54.50,G89.29 (ICD-10-CM) - Chronic midline low back pain without sciatica  Rationale for Evaluation and Treatment: Rehabilitation  THERAPY DIAG:  Other low back pain  Pain in left foot  Muscle weakness (generalized)  Difficulty in walking, not elsewhere classified  ONSET DATE: Over 6 months  SUBJECTIVE:                                                                                                                                                                                           SUBJECTIVE STATEMENT: She indicated some soreness in back upon arrival.  She indicated no worse from HEP.   She felt like foot was better upon waking recently.     PERTINENT HISTORY:  Concurrent plantar  fasciitis per MD note.  Hyperlipidemia  PAIN:  NPRS scale: 0/10 "sore" Pain location: back, groin bilateral, Lt foot Pain description: sharp,  Aggravating factors: lifting, carrying, bending over, getting up from bending over, initial mobility after inactivity Relieving factors: general movement helps, heating pad, icy hot  PRECAUTIONS: None  WEIGHT BEARING RESTRICTIONS: No  FALLS:  Has patient fallen in last 6 months? No  LIVING ENVIRONMENT: Lives in: House/apartment  OCCUPATION: Retired  PLOF: Independent, yard work, Environmental consultant  PATIENT GOALS: Reduce pain.    OBJECTIVE:   PATIENT SURVEYS:  03/21/2022 FOTO eval:  70  predicted:  75  SCREENING FOR RED FLAGS: 03/21/2022 Bowel or bladder incontinence: No Cauda equina syndrome: No  COGNITION: 03/21/2022 Overall cognitive status: WFL normal      SENSATION: 03/21/2022 No specific limitation to light touch  MUSCLE LENGTH: 03/21/2022 Unremarkable   POSTURE: 03/21/2022  Mild lateral shift trunk to Lt but easily corrected and gone for the rest of the visit.   PALPATION: 03/21/2022 No specific tenderness in lumbar region to light touch today  LUMBAR ROM:   AROM 03/21/2022 03/27/2022  Flexion Movement to mid shin c pain noted in lumbar Movement to to mid shin c no pain increase  Extension 75% WFL with no specific pain complaints  Repeated x 5 in standing : improved to 100 % c  100% WFL  Right lateral flexion Pain in Rt lumbar   Left lateral flexion Pain in Rt lumbar   Right rotation    Left rotation     (Blank rows = not tested)  LOWER EXTREMITY ROM:      Right 03/21/2022 Left 03/21/2022  Hip flexion    Hip extension    Hip abduction    Hip adduction    Hip internal rotation    Hip external rotation    Knee flexion    Knee extension    Ankle dorsiflexion    Ankle plantarflexion    Ankle inversion    Ankle eversion     (Blank rows = not tested)  MMT:    MMT Right 03/21/2022 Left 03/21/2022  Hip flexion 5/5 5/5   Hip extension    Hip abduction    Hip adduction    Hip internal rotation    Hip external rotation    Knee flexion 5/5 5/5  Knee extension 5/5 5/5  Ankle dorsiflexion 5/5 5/5  Ankle plantarflexion Check in future 2+/5 (pain in WB PF)  Ankle inversion    Ankle eversion     (Blank rows = not tested)  SPECIAL TESTS:  03/21/2022 (-) Slump bilateral  FUNCTIONAL TESTS:  03/21/2022 18 inch chair s UE on 1st   GAIT: 03/21/2022 Independent ambulation  TODAY'S TREATMENT:                                                                             DATE: 03/27/2022  Therex: Nustep LE only Lvl 5 x 6  Standing lumbar extension AROM x 5 Supine lumbar trunk rotation stretch 15 sec x 1 bilateral Supine bridge 3 sec hold x 10  Supine hip abduction clam shell green band x 20 isometric hold contralateral leg Supine hip flexion isometric hold against ipsilateral hand in 90 deg 5 sec hold x 10  Seated multifidi isometric UE lift into table 5 sec hold x 10  Incline gastroc stretch 30 sec x 3 bilateral      TODAY'S TREATMENT:  DATE: 03/21/2022  Therex:    HEP instruction/performance c cues for techniques, handout provided.  Trial set performed of each for comprehension and symptom assessment.  See below for exercise list  PATIENT EDUCATION:  03/21/2022 Education details: HEP, POC Person educated: Patient Education method: Explanation, Demonstration, Verbal cues, and Handouts Education comprehension: verbalized understanding, returned demonstration, and verbal cues required  HOME EXERCISE PROGRAM: Access Code: [facility] URL: [email or web address] Date: 03/21/2022 Prepared by: Scot Jun  Exercises - Supine Bridge  - 1-2 x daily - 7 x weekly - 1-2 sets - 10 reps - 2 hold - Supine Lower Trunk Rotation  - 2-3 x daily - 7 x weekly - 1 sets - 3-5 reps - 15 hold - Standing Lumbar Extension with Counter  - 3-5 x  daily - 7 x weekly - 1 sets - 5-10 reps - Supine Piriformis Stretch Pulling Heel to Hip (Mirrored)  - 2-3 x daily - 7 x weekly - 1 sets - 5 reps - 15-30 hold - Gastroc Stretch on Wall  - 2 x daily - 7 x weekly - 1 sets - 5 reps - 30 hold  ASSESSMENT:  CLINICAL IMPRESSION: Mild improvements noted in lumbar mobility and symptom response compared to evaluation presentation.  Continued complaints of low back symptoms during daily activity that can impair functional activity tolerance.  Continued skilled PT services warranted.    OBJECTIVE IMPAIRMENTS: decreased activity tolerance, decreased coordination, decreased endurance, decreased mobility, difficulty walking, decreased ROM, decreased strength, impaired perceived functional ability, impaired flexibility, improper body mechanics, and pain.   ACTIVITY LIMITATIONS: carrying, lifting, bending, sitting, standing, squatting, transfers, bed mobility, and locomotion level  PARTICIPATION LIMITATIONS: cleaning, laundry, interpersonal relationship, shopping, community activity, and yard work  PERSONAL FACTORS: Time since onset of injury/illness/exacerbation are also affecting patient's functional outcome.   REHAB POTENTIAL: Good  CLINICAL DECISION MAKING: Stable/uncomplicated  EVALUATION COMPLEXITY: Low   GOALS: Goals reviewed with patient? Yes  SHORT TERM GOALS: (target date for Short term goals are 3 weeks 04/11/2022)  1. Patient will demonstrate independent use of home exercise program to maintain progress from in clinic treatments.  Goal status: on going 03/27/2022  LONG TERM GOALS: (target dates for all long term goals are 10 weeks  05/30/2022 )   1. Patient will demonstrate/report pain at worst less than or equal to 2/10 to facilitate minimal limitation in daily activity secondary to pain symptoms.  Goal status: New   2. Patient will demonstrate independent use of home exercise program to facilitate ability to maintain/progress  functional gains from skilled physical therapy services.  Goal status: New   3. Patient will demonstrate FOTO outcome > or = 75 % to indicate reduced disability due to condition.  Goal status: New   4. Patient will demonstrate lumbar extension 100 % WFL s symptoms to facilitate upright standing, walking posture at PLOF s limitation.  Goal status: New   5.  Patient will demonstrate lateral flexion lumbar s symptoms to facilitate mobility at PLOF.   Goal status: New   6.  Patient will demonstrate/report ability to perform usual housework/laundry at [facility] s limitation.  Goal status: New   7.  Patient will demonstrate/report ability to perform ambulation s restriction due to symptoms.  Goal Status: New  PLAN:  PT FREQUENCY: 1-2x/week  PT DURATION: 10 weeks  PLANNED INTERVENTIONS: Therapeutic exercises, Therapeutic activity, Neuro Muscular re-education, Balance training, Gait training, Patient/Family education, Joint mobilization, Stair training, DME instructions, Dry Needling, Electrical stimulation, Cryotherapy, vasopneumatic device, Moist heat,  Taping, Traction Ultrasound, Ionotophoresis '4mg'$ /ml Dexamethasone, and Manual therapy.  All included unless contraindicated  PLAN FOR NEXT SESSION: Recommend intro light core stability and postural support progression as tolerated.    Scot Jun, PT, DPT, OCS, ATC 03/27/22  3:00 PM    Referring diagnosis? M54.50,G89.29 (ICD-10-CM) - Chronic midline low back pain without sciatica Treatment diagnosis? (if different than referring diagnosis) M54.59 What was this (referring dx) caused by? '[]'$  Surgery '[]'$  Fall '[x]'$  Ongoing issue '[]'$  Arthritis '[]'$  Other: ____________  Laterality: '[]'$  Rt '[]'$  Lt '[x]'$  Both  Check all possible CPT codes:  *CHOOSE 10 OR LESS*    '[]'$  97110 (Therapeutic Exercise)  '[]'$  92507 (SLP Treatment)  '[]'$  21975 (Neuro Re-ed)   '[]'$  92526 (Swallowing Treatment)   '[]'$  97116 (Gait Training)   '[]'$  [id number] (Cognitive Training, 1st 15  minutes) '[]'$  97140 (Manual Therapy)   '[]'$  97130 (Cognitive Training, each add'l 15 minutes)  '[]'$  97164 (Re-evaluation)                              '[]'$  Other, List CPT Code ____________  '[]'$  88325 (Therapeutic Activities)     '[]'$  97535 (Self Care)   '[x]'$  All codes above (97110 - 97535)  '[]'$  97012 (Mechanical Traction)  '[x]'$  97014 (E-stim Unattended)  '[]'$  97032 (E-stim manual)  '[]'$  97033 (Ionto)  '[]'$  97035 (Ultrasound) '[x]'$  97750 (Physical Performance Training) '[]'$  [id number] (Aquatic Therapy) '[]'$  97016 (Vasopneumatic Device) '[]'$  49826 (Paraffin) '[]'$  97034 (Contrast Bath) '[]'$  97597 (Wound Care 1st 20 sq cm) '[]'$  97598 (Wound Care each add'l 20 sq cm) '[]'$  97760 (Orthotic Fabrication, Fitting, Training Initial) '[]'$  [id number] (Prosthetic Management and Training Initial) '[]'$  [id number] (Orthotic or Prosthetic Training/ Modification Subsequent)

## 2022-04-01 NOTE — Therapy (Unsigned)
OUTPATIENT PHYSICAL THERAPY TREATMENT   Patient Name: Barbara Roman MRN: [id number] DOB:Jul 18, 1955, 67 y.o., female Today's Date: 04/02/2022  END OF SESSION:  PT End of Session - 04/02/22 1544     Visit Number 3    Number of Visits 24    Date for PT Re-Evaluation 06/13/22    Authorization Type [employer] $20 copay    Authorization Time Period 03/21/2022-06/13/2022    Authorization - Visit Number 3    Authorization - Number of Visits 12    PT Start Time [id number]    PT Stop Time 1555    PT Time Calculation (min) 40 min    Activity Tolerance Patient tolerated treatment well    Behavior During Therapy [facility] for tasks assessed/performed               Past Medical History:  Diagnosis Date   Cervical dysplasia    Dysmenorrhea    Fibroid    Hyperlipidemia    Vitamin D deficiency    Past Surgical History:  Procedure Laterality Date   COLPOSCOPY     FOOT SURGERY     TOTAL VAGINAL HYSTERECTOMY  02/17/1997   TUBAL LIGATION     Patient Active Problem List   Diagnosis Date Noted   Hyperlipidemia 07/06/2018   H/O vitamin D deficiency 12/24/2015   Family history of diabetes mellitus 11/12/2012    PCP: Denita Lung, MD  REFERRING PROVIDER: Denita Lung, MD  REFERRING DIAG: M54.50,G89.29 (ICD-10-CM) - Chronic midline low back pain without sciatica  Rationale for Evaluation and Treatment: Rehabilitation  THERAPY DIAG:  Other low back pain  Muscle weakness (generalized)  Pain in left foot  Difficulty in walking, not elsewhere classified  ONSET DATE: Over 6 months  SUBJECTIVE:                                                                                                                                                                                           SUBJECTIVE STATEMENT: She reports continued lower back pain today.     PERTINENT HISTORY:  Concurrent plantar fasciitis per MD note.  Hyperlipidemia  PAIN:  NPRS scale: 0/10 "sore" Pain location:  back, groin bilateral, Lt foot Pain description: sharp,  Aggravating factors: lifting, carrying, bending over, getting up from bending over, initial mobility after inactivity Relieving factors: general movement helps, heating pad, icy hot  PRECAUTIONS: None  WEIGHT BEARING RESTRICTIONS: No  FALLS:  Has patient fallen in last 6 months? No  LIVING ENVIRONMENT: Lives in: House/apartment  OCCUPATION: Retired  PLOF: Independent, yard work, Environmental consultant  PATIENT GOALS: Reduce pain.    OBJECTIVE:   PATIENT  SURVEYS:  03/21/2022 FOTO eval:  70  predicted:  75  SCREENING FOR RED FLAGS: 03/21/2022 Bowel or bladder incontinence: No Cauda equina syndrome: No  COGNITION: 03/21/2022 Overall cognitive status: WFL normal      SENSATION: 03/21/2022 No specific limitation to light touch  MUSCLE LENGTH: 03/21/2022 Unremarkable   POSTURE: 03/21/2022  Mild lateral shift trunk to Lt but easily corrected and gone for the rest of the visit.   PALPATION: 03/21/2022 No specific tenderness in lumbar region to light touch today  LUMBAR ROM:   AROM 03/21/2022 03/27/2022  Flexion Movement to mid shin c pain noted in lumbar Movement to to mid shin c no pain increase  Extension 75% WFL with no specific pain complaints  Repeated x 5 in standing : improved to 100 % c  100% WFL  Right lateral flexion Pain in Rt lumbar   Left lateral flexion Pain in Rt lumbar   Right rotation    Left rotation     (Blank rows = not tested)  LOWER EXTREMITY ROM:      Right 03/21/2022 Left 03/21/2022  Hip flexion    Hip extension    Hip abduction    Hip adduction    Hip internal rotation    Hip external rotation    Knee flexion    Knee extension    Ankle dorsiflexion    Ankle plantarflexion    Ankle inversion    Ankle eversion     (Blank rows = not tested)  MMT:    MMT Right 03/21/2022 Left 03/21/2022  Hip flexion 5/5 5/5  Hip extension    Hip abduction    Hip adduction    Hip internal rotation    Hip external  rotation    Knee flexion 5/5 5/5  Knee extension 5/5 5/5  Ankle dorsiflexion 5/5 5/5  Ankle plantarflexion Check in future 2+/5 (pain in WB PF)  Ankle inversion    Ankle eversion     (Blank rows = not tested)  SPECIAL TESTS:  03/21/2022 (-) Slump bilateral  FUNCTIONAL TESTS:  03/21/2022 18 inch chair s UE on 1st   GAIT: 03/21/2022 Independent ambulation  TODAY'S TREATMENT:                                                                             DATE: 04/02/2022  Therex: Nustep LE only Lvl 5 x 6  Standing lumbar extension AROM x 5 Supine lumbar trunk rotation stretch 15 sec x 1 bilateral TrA contractions in supine with tactile cues x10  Supine clams with Green TB x20  Supine marching green TB x 1 min Supine bridge 3 sec hold x 10 - attempted with reports of immediate pain reported.  Supine hip flexion isometric hold against ipsilateral hand in 90 deg 5 sec hold x 10  Sit to stand x10 from high low mat  Incline gastroc stretch 30 sec x 3 bilateral   TODAY'S TREATMENT:  DATE: 03/27/2022  Therex: Nustep LE only Lvl 5 x 6  Standing lumbar extension AROM x 5 Supine lumbar trunk rotation stretch 15 sec x 1 bilateral Supine bridge 3 sec hold x 10  Supine hip abduction clam shell green band x 20 isometric hold contralateral leg Supine hip flexion isometric hold against ipsilateral hand in 90 deg 5 sec hold x 10  Seated multifidi isometric UE lift into table 5 sec hold x 10  Incline gastroc stretch 30 sec x 3 bilateral      TODAY'S TREATMENT:                                                                             DATE: 03/21/2022  Therex:    HEP instruction/performance c cues for techniques, handout provided.  Trial set performed of each for comprehension and symptom assessment.  See below for exercise list  PATIENT EDUCATION:  03/21/2022 Education details: HEP, POC Person educated: Patient Education  method: Explanation, Demonstration, Verbal cues, and Handouts Education comprehension: verbalized understanding, returned demonstration, and verbal cues required  HOME EXERCISE PROGRAM: Access Code: [facility] URL: [email or web address] Date: 03/21/2022 Prepared by: Scot Jun  Exercises - Supine Bridge  - 1-2 x daily - 7 x weekly - 1-2 sets - 10 reps - 2 hold - Supine Lower Trunk Rotation  - 2-3 x daily - 7 x weekly - 1 sets - 3-5 reps - 15 hold - Standing Lumbar Extension with Counter  - 3-5 x daily - 7 x weekly - 1 sets - 5-10 reps - Supine Piriformis Stretch Pulling Heel to Hip (Mirrored)  - 2-3 x daily - 7 x weekly - 1 sets - 5 reps - 15-30 hold - Gastroc Stretch on Wall  - 2 x daily - 7 x weekly - 1 sets - 5 reps - 30 hold  ASSESSMENT:  CLINICAL IMPRESSION: Mild improvements noted in lumbar mobility and symptom response today. She was able to progress with strengthening with today with no adverse effects. Continued complaints of low back symptoms during daily activity that can impair functional activity tolerance.  Continued skilled PT services warranted.   OBJECTIVE IMPAIRMENTS: decreased activity tolerance, decreased coordination, decreased endurance, decreased mobility, difficulty walking, decreased ROM, decreased strength, impaired perceived functional ability, impaired flexibility, improper body mechanics, and pain.   ACTIVITY LIMITATIONS: carrying, lifting, bending, sitting, standing, squatting, transfers, bed mobility, and locomotion level  PARTICIPATION LIMITATIONS: cleaning, laundry, interpersonal relationship, shopping, community activity, and yard work  PERSONAL FACTORS: Time since onset of injury/illness/exacerbation are also affecting patient's functional outcome.   REHAB POTENTIAL: Good  CLINICAL DECISION MAKING: Stable/uncomplicated  EVALUATION COMPLEXITY: Low   GOALS: Goals reviewed with patient? Yes  SHORT TERM GOALS: (target date for  Short term goals are 3 weeks 04/11/2022)  1. Patient will demonstrate independent use of home exercise program to maintain progress from in clinic treatments.  Goal status: on going 03/27/2022  LONG TERM GOALS: (target dates for all long term goals are 10 weeks  05/30/2022 )   1. Patient will demonstrate/report pain at worst less than or equal to 2/10 to facilitate minimal limitation in daily activity secondary to pain symptoms.  Goal status: New   2. Patient will  demonstrate independent use of home exercise program to facilitate ability to maintain/progress functional gains from skilled physical therapy services.  Goal status: New   3. Patient will demonstrate FOTO outcome > or = 75 % to indicate reduced disability due to condition.  Goal status: New   4. Patient will demonstrate lumbar extension 100 % WFL s symptoms to facilitate upright standing, walking posture at PLOF s limitation.  Goal status: New   5.  Patient will demonstrate lateral flexion lumbar s symptoms to facilitate mobility at PLOF.   Goal status: New   6.  Patient will demonstrate/report ability to perform usual housework/laundry at [facility] s limitation.  Goal status: New   7.  Patient will demonstrate/report ability to perform ambulation s restriction due to symptoms.  Goal Status: New  PLAN:  PT FREQUENCY: 1-2x/week  PT DURATION: 10 weeks  PLANNED INTERVENTIONS: Therapeutic exercises, Therapeutic activity, Neuro Muscular re-education, Balance training, Gait training, Patient/Family education, Joint mobilization, Stair training, DME instructions, Dry Needling, Electrical stimulation, Cryotherapy, vasopneumatic device, Moist heat, Taping, Traction Ultrasound, Ionotophoresis 82m/ml Dexamethasone, and Manual therapy.  All included unless contraindicated  PLAN FOR NEXT SESSION: Recommend intro light core stability and postural support progression as tolerated.    MScot Jun PT, DPT, OCS, ATC 04/02/22  3:45  PM    Referring diagnosis? M54.50,G89.29 (ICD-10-CM) - Chronic midline low back pain without sciatica Treatment diagnosis? (if different than referring diagnosis) M54.59 What was this (referring dx) caused by? []$  Surgery []$  Fall [x]$  Ongoing issue []$  Arthritis []$  Other: ____________  Laterality: []$  Rt []$  Lt [x]$  Both  Check all possible CPT codes:  *CHOOSE 10 OR LESS*    []$  97110 (Therapeutic Exercise)  []$  92507 (SLP Treatment)  []$  97112 (Neuro Re-ed)   []$  92526 (Swallowing Treatment)   []$  97116 (Gait Training)   []$  [id number](Cognitive Training, 1st 15 minutes) []$  97140 (Manual Therapy)   []$  97130 (Cognitive Training, each add'l 15 minutes)  []$  97164 (Re-evaluation)                              []$  Other, List CPT Code ____________  []$  97530 (Therapeutic Activities)     []$  [id number](Self Care)   [x]$  All codes above (97110 - 97535)  []$  97012 (Mechanical Traction)  [x]$  97014 (E-stim Unattended)  []$  97032 (E-stim manual)  []$  97033 (Ionto)  []$  97035 (Ultrasound) [x]$  97750 (Physical Performance Training) []$  [id number](Aquatic Therapy) []$  97016 (Vasopneumatic Device) []$  [id number](Paraffin) []$  97034 (Contrast Bath) []$  97597 (Wound Care 1st 20 sq cm) []$  97598 (Wound Care each add'l 20 sq cm) []$  97760 (Orthotic Fabrication, Fitting, Training Initial) []$  [id number](Prosthetic Management and Training Initial) []$  [id number](Orthotic or Prosthetic Training/ Modification Subsequent)   SRudi HeapPT, DPT 04/02/22  3:45 PM '

## 2022-04-02 ENCOUNTER — Encounter: Payer: Self-pay | Admitting: Physical Therapy

## 2022-04-02 ENCOUNTER — Encounter: Payer: Medicare PPO | Admitting: Physical Therapy

## 2022-04-02 ENCOUNTER — Ambulatory Visit: Payer: Medicare PPO | Admitting: Physical Therapy

## 2022-04-02 DIAGNOSIS — M5459 Other low back pain: Secondary | ICD-10-CM

## 2022-04-02 DIAGNOSIS — R262 Difficulty in walking, not elsewhere classified: Secondary | ICD-10-CM | POA: Diagnosis not present

## 2022-04-02 DIAGNOSIS — M6281 Muscle weakness (generalized): Secondary | ICD-10-CM

## 2022-04-02 DIAGNOSIS — M79672 Pain in left foot: Secondary | ICD-10-CM

## 2022-04-09 ENCOUNTER — Ambulatory Visit: Payer: Medicare PPO | Admitting: Rehabilitative and Restorative Service Providers"

## 2022-04-09 ENCOUNTER — Encounter: Payer: Self-pay | Admitting: Rehabilitative and Restorative Service Providers"

## 2022-04-09 DIAGNOSIS — M5459 Other low back pain: Secondary | ICD-10-CM

## 2022-04-09 DIAGNOSIS — R262 Difficulty in walking, not elsewhere classified: Secondary | ICD-10-CM

## 2022-04-09 DIAGNOSIS — M6281 Muscle weakness (generalized): Secondary | ICD-10-CM

## 2022-04-09 DIAGNOSIS — M79672 Pain in left foot: Secondary | ICD-10-CM

## 2022-04-09 NOTE — Therapy (Signed)
OUTPATIENT PHYSICAL THERAPY TREATMENT   Patient Name: Barbara Roman MRN: [id number] DOB:1955/07/03, 67 y.o., female Today's Date: 04/09/2022  END OF SESSION:  PT End of Session - 04/09/22 1308     Visit Number 4    Number of Visits 24    Date for PT Re-Evaluation 06/13/22    Authorization Type [employer] $20 copay    Authorization Time Period 03/21/2022-06/13/2022    Authorization - Visit Number 4    Authorization - Number of Visits 12    PT Start Time 1300    PT Stop Time 1340    PT Time Calculation (min) 40 min    Activity Tolerance Patient tolerated treatment well    Behavior During Therapy WFL for tasks assessed/performed                Past Medical History:  Diagnosis Date   Cervical dysplasia    Dysmenorrhea    Fibroid    Hyperlipidemia    Vitamin D deficiency    Past Surgical History:  Procedure Laterality Date   COLPOSCOPY     FOOT SURGERY     TOTAL VAGINAL HYSTERECTOMY  02/17/1997   TUBAL LIGATION     Patient Active Problem List   Diagnosis Date Noted   Hyperlipidemia 07/06/2018   H/O vitamin D deficiency 12/24/2015   Family history of diabetes mellitus 11/12/2012    PCP: Denita Lung, MD  REFERRING PROVIDER: Denita Lung, MD  REFERRING DIAG: M54.50,G89.29 (ICD-10-CM) - Chronic midline low back pain without sciatica  Rationale for Evaluation and Treatment: Rehabilitation  THERAPY DIAG:  Other low back pain  Muscle weakness (generalized)  Pain in left foot  Difficulty in walking, not elsewhere classified  ONSET DATE: Over 6 months  SUBJECTIVE:                                                                                                                                                                                           SUBJECTIVE STATEMENT: She indicated some pain at times c bending for back and some instability/pain in foot/ankle at times in walking.  No pain reported upon arrival today.   She indicated she thought  today might be her last day for now.   Global Rating of Change somewhat better +3  PERTINENT HISTORY:  Concurrent plantar fasciitis per MD note.  Hyperlipidemia  PAIN:  NPRS scale: 0/10 "sore" Pain location: back, groin bilateral, Lt foot Pain description: sharp,  Aggravating factors: lifting, carrying, bending over, getting up from bending over, initial mobility after inactivity Relieving factors: general movement helps, heating pad, icy hot  PRECAUTIONS: None  WEIGHT BEARING  RESTRICTIONS: No  FALLS:  Has patient fallen in last 6 months? No  LIVING ENVIRONMENT: Lives in: House/apartment  OCCUPATION: Retired  PLOF: Independent, yard work, Environmental consultant  PATIENT GOALS: Reduce pain.    OBJECTIVE:   PATIENT SURVEYS:  04/09/2022:  FOTO update 63  03/21/2022 FOTO eval:  70  predicted:  75  SCREENING FOR RED FLAGS: 03/21/2022 Bowel or bladder incontinence: No Cauda equina syndrome: No  COGNITION: 03/21/2022 Overall cognitive status: WFL normal      SENSATION: 03/21/2022 No specific limitation to light touch  MUSCLE LENGTH: 03/21/2022 Unremarkable   POSTURE: 03/21/2022  Mild lateral shift trunk to Lt but easily corrected and gone for the rest of the visit.   PALPATION: 03/21/2022 No specific tenderness in lumbar region to light touch today  LUMBAR ROM:   AROM 03/21/2022 03/27/2022  Flexion Movement to mid shin c pain noted in lumbar Movement to to mid shin c no pain increase  Extension 75% WFL with no specific pain complaints  Repeated x 5 in standing : improved to 100 % c  100% WFL  Right lateral flexion Pain in Rt lumbar   Left lateral flexion Pain in Rt lumbar   Right rotation    Left rotation     (Blank rows = not tested)  LOWER EXTREMITY ROM:      Right 03/21/2022 Left 03/21/2022  Hip flexion    Hip extension    Hip abduction    Hip adduction    Hip internal rotation    Hip external rotation    Knee flexion    Knee extension    Ankle dorsiflexion    Ankle  plantarflexion    Ankle inversion    Ankle eversion     (Blank rows = not tested)  MMT:    MMT Right 03/21/2022 Left 03/21/2022 Right 03/21/2022 Left 04/09/2022  Hip flexion 5/5 5/5    Hip extension      Hip abduction      Hip adduction      Hip internal rotation      Hip external rotation      Knee flexion 5/5 5/5    Knee extension 5/5 5/5    Ankle dorsiflexion 5/5 5/5    Ankle plantarflexion Check in future 2+/5 (pain in WB PF) 4/5 (12 single leg reps in standing) 4/5 (10 single leg reps in standing)  Ankle inversion      Ankle eversion       (Blank rows = not tested)  SPECIAL TESTS:  03/21/2022 (-) Slump bilateral  FUNCTIONAL TESTS:  03/21/2022 18 inch chair s UE on 1st   GAIT: 03/21/2022 Independent ambulation  TODAY'S TREATMENT:                                                                             DATE: 04/09/2022  Therex: Incline gastroc stretch 30 sec x 5 bilateral  Nustep LE only Lvl 5 x 10:30 Standing single leg PF x 10 bilateral  Additional time spent in review of HEP c technique cues and recommendations on sequencing and frequency of use.    Neuro Re-ed Tandem stance x 1 min x 1 bilateral occasional HHA  Wobble board fwd/back light  touching x 25 each way occasional HHA SLS c black mat corner touching 4 points x 8 bilaterally  TODAY'S TREATMENT:                                                                             DATE: 04/02/2022  Therex: Nustep LE only Lvl 5 x 6  Standing lumbar extension AROM x 5 Supine lumbar trunk rotation stretch 15 sec x 1 bilateral TrA contractions in supine with tactile cues x10  Supine clams with Green TB x20  Supine marching green TB x 1 min Supine bridge 3 sec hold x 10 - attempted with reports of immediate pain reported.  Supine hip flexion isometric hold against ipsilateral hand in 90 deg 5 sec hold x 10  Sit to stand x10 from high low mat  Incline gastroc stretch 30 sec x 3 bilateral   TODAY'S TREATMENT:                                                                              DATE: 03/27/2022  Therex: Nustep LE only Lvl 5 x 6  Standing lumbar extension AROM x 5 Supine lumbar trunk rotation stretch 15 sec x 1 bilateral Supine bridge 3 sec hold x 10  Supine hip abduction clam shell green band x 20 isometric hold contralateral leg Supine hip flexion isometric hold against ipsilateral hand in 90 deg 5 sec hold x 10  Seated multifidi isometric UE lift into table 5 sec hold x 10  Incline gastroc stretch 30 sec x 3 bilateral   PATIENT EDUCATION:  04/09/2022  Education details: HEP update Person educated: Patient Education method: Consulting civil engineer, Demonstration, Verbal cues, and Handouts Education comprehension: verbalized understanding, returned demonstration, and verbal cues required  HOME EXERCISE PROGRAM: Access Code: [facility] URL: [email or web address] Date: 04/09/2022 Prepared by: Scot Jun  Exercises - Standing Lumbar Extension with Counter  - 3-5 x daily - 7 x weekly - 1 sets - 5-10 reps - Supine Lower Trunk Rotation  - 2-3 x daily - 7 x weekly - 1 sets - 3-5 reps - 15 hold - Supine Piriformis Stretch Pulling Heel to Hip (Mirrored)  - 2-3 x daily - 7 x weekly - 1 sets - 5 reps - 15-30 hold - Supine Bridge  - 1-2 x daily - 7 x weekly - 1-2 sets - 10 reps - 2 hold - Heel Toe Raises with Counter Support  - 1-2 x daily - 7 x weekly - 1-2 sets - 10-15 reps - Gastroc Stretch on Wall  - 2 x daily - 7 x weekly - 1 sets - 5 reps - 30 hold - Hooklying Isometric Hip Flexion  - 1 x daily - 7 x weekly - 1 sets - 10 reps - 5-10 hold - Hooklying Isometric Clamshell  - 1 x daily - 7 x weekly - 3 sets - 10-15 reps - Sit to Stand  -  3 x daily - 7 x weekly - 1 sets - 10 reps  ASSESSMENT:  CLINICAL IMPRESSION: Pt demonstrated /report mild improvements in symptoms and reported global rating of change +3 at this time.  FOTO reassessment showed mild reduction in score but this did not align with  patient's report of improvements.  Encouraged continued skilled PT services attendance to follow up on bending movement pain complaints.    OBJECTIVE IMPAIRMENTS: decreased activity tolerance, decreased coordination, decreased endurance, decreased mobility, difficulty walking, decreased ROM, decreased strength, impaired perceived functional ability, impaired flexibility, improper body mechanics, and pain.   ACTIVITY LIMITATIONS: carrying, lifting, bending, sitting, standing, squatting, transfers, bed mobility, and locomotion level  PARTICIPATION LIMITATIONS: cleaning, laundry, interpersonal relationship, shopping, community activity, and yard work  PERSONAL FACTORS: Time since onset of injury/illness/exacerbation are also affecting patient's functional outcome.   REHAB POTENTIAL: Good  CLINICAL DECISION MAKING: Stable/uncomplicated  EVALUATION COMPLEXITY: Low   GOALS: Goals reviewed with patient? Yes  SHORT TERM GOALS: (target date for Short term goals are 3 weeks 04/11/2022)  1. Patient will demonstrate independent use of home exercise program to maintain progress from in clinic treatments.  Goal status: on going 03/27/2022  LONG TERM GOALS: (target dates for all long term goals are 10 weeks  05/30/2022 )   1. Patient will demonstrate/report pain at worst less than or equal to 2/10 to facilitate minimal limitation in daily activity secondary to pain symptoms.  Goal status: New   2. Patient will demonstrate independent use of home exercise program to facilitate ability to maintain/progress functional gains from skilled physical therapy services.  Goal status: New   3. Patient will demonstrate FOTO outcome > or = 75 % to indicate reduced disability due to condition.  Goal status: New   4. Patient will demonstrate lumbar extension 100 % WFL s symptoms to facilitate upright standing, walking posture at PLOF s limitation.  Goal status: New   5.  Patient will demonstrate lateral  flexion lumbar s symptoms to facilitate mobility at PLOF.   Goal status: New   6.  Patient will demonstrate/report ability to perform usual housework/laundry at [facility] s limitation.  Goal status: New   7.  Patient will demonstrate/report ability to perform ambulation s restriction due to symptoms.  Goal Status: New  PLAN:  PT FREQUENCY: 1-2x/week  PT DURATION: 10 weeks  PLANNED INTERVENTIONS: Therapeutic exercises, Therapeutic activity, Neuro Muscular re-education, Balance training, Gait training, Patient/Family education, Joint mobilization, Stair training, DME instructions, Dry Needling, Electrical stimulation, Cryotherapy, vasopneumatic device, Moist heat, Taping, Traction Ultrasound, Ionotophoresis 9m/ml Dexamethasone, and Manual therapy.  All included unless contraindicated  PLAN FOR NEXT SESSION: Continue progressive core/LE strengthening as tolerated, check bending movement patterns.    MScot Jun PT, DPT, OCS, ATC 04/09/22  1:49 PM      Referring diagnosis? M54.50,G89.29 (ICD-10-CM) - Chronic midline low back pain without sciatica Treatment diagnosis? (if different than referring diagnosis) M54.59 What was this (referring dx) caused by? []$  Surgery []$  Fall [x]$  Ongoing issue []$  Arthritis []$  Other: ____________  Laterality: []$  Rt []$  Lt [x]$  Both  Check all possible CPT codes:  *CHOOSE 10 OR LESS*    []$  97110 (Therapeutic Exercise)  []$  92507 (SLP Treatment)  []$  97112 (Neuro Re-ed)   []$  92526 (Swallowing Treatment)   []$  97116 (Gait Training)   []$  [id number](Cognitive Training, 1st 15 minutes) []$  97140 (Manual Therapy)   []$  97130 (Cognitive Training, each add'l 15 minutes)  []$  97164 (Re-evaluation)                              []$   Other, List CPT Code ____________  []$  [id number] (Therapeutic Activities)     []$  [id number] (Self Care)   [x]$  All codes above (97110 - 97535)  []$  97012 (Mechanical Traction)  [x]$  97014 (E-stim Unattended)  []$  97032 (E-stim manual)  []$  97033  (Ionto)  []$  97035 (Ultrasound) [x]$  97750 (Physical Performance Training) []$  [id number] (Aquatic Therapy) []$  97016 (Vasopneumatic Device) []$  [id number] (Paraffin) []$  97034 (Contrast Bath) []$  97597 (Wound Care 1st 20 sq cm) []$  97598 (Wound Care each add'l 20 sq cm) []$  97760 (Orthotic Fabrication, Fitting, Training Initial) []$  [id number] (Prosthetic Management and Training Initial) []$  [id number] (Orthotic or Prosthetic Training/ Modification Subsequent)   Rudi Heap PT, DPT 04/09/22  1:49 PM '

## 2022-04-16 ENCOUNTER — Encounter: Payer: Medicare PPO | Admitting: Rehabilitative and Restorative Service Providers"

## 2022-04-16 ENCOUNTER — Ambulatory Visit: Payer: Medicare PPO | Admitting: Rehabilitative and Restorative Service Providers"

## 2022-04-16 ENCOUNTER — Encounter: Payer: Self-pay | Admitting: Rehabilitative and Restorative Service Providers"

## 2022-04-16 DIAGNOSIS — M6281 Muscle weakness (generalized): Secondary | ICD-10-CM

## 2022-04-16 DIAGNOSIS — M5459 Other low back pain: Secondary | ICD-10-CM | POA: Diagnosis not present

## 2022-04-16 DIAGNOSIS — M79672 Pain in left foot: Secondary | ICD-10-CM | POA: Diagnosis not present

## 2022-04-16 DIAGNOSIS — R262 Difficulty in walking, not elsewhere classified: Secondary | ICD-10-CM | POA: Diagnosis not present

## 2022-04-16 NOTE — Therapy (Signed)
OUTPATIENT PHYSICAL THERAPY TREATMENT   Patient Name: Barbara Roman MRN: [id number] DOB:Jul 07, 1955, 67 y.o., female Today's Date: 04/16/2022  END OF SESSION:  PT End of Session - 04/16/22 1338     Visit Number 5    Number of Visits 24    Date for PT Re-Evaluation 06/13/22    Authorization Type [employer] $20 copay    Authorization Time Period 03/21/2022-06/13/2022    Authorization - Visit Number 5    Authorization - Number of Visits 12    PT Start Time [id number]    PT Stop Time [id number]    PT Time Calculation (min) 38 min    Activity Tolerance Patient tolerated treatment well    Behavior During Therapy WFL for tasks assessed/performed                 Past Medical History:  Diagnosis Date   Cervical dysplasia    Dysmenorrhea    Fibroid    Hyperlipidemia    Vitamin D deficiency    Past Surgical History:  Procedure Laterality Date   COLPOSCOPY     FOOT SURGERY     TOTAL VAGINAL HYSTERECTOMY  02/17/1997   TUBAL LIGATION     Patient Active Problem List   Diagnosis Date Noted   Hyperlipidemia 07/06/2018   H/O vitamin D deficiency 12/24/2015   Family history of diabetes mellitus 11/12/2012    PCP: Denita Lung, MD  REFERRING PROVIDER: Denita Lung, MD  REFERRING DIAG: M54.50,G89.29 (ICD-10-CM) - Chronic midline low back pain without sciatica  Rationale for Evaluation and Treatment: Rehabilitation  THERAPY DIAG:  Other low back pain  Muscle weakness (generalized)  Pain in left foot  Difficulty in walking, not elsewhere classified  ONSET DATE: Over 6 months  SUBJECTIVE:                                                                                                                                                                                           SUBJECTIVE STATEMENT: She indicated no specific pain upon arrival.  She did self report not doing the exercises every day.   PERTINENT HISTORY:  Concurrent plantar fasciitis per MD note.   Hyperlipidemia  PAIN:  NPRS scale: 0/10 "sore" Pain location: back, groin bilateral, Lt foot Pain description: sharp,  Aggravating factors: lifting, carrying, bending over, getting up from bending over, initial mobility after inactivity Relieving factors: general movement helps, heating pad, icy hot  PRECAUTIONS: None  WEIGHT BEARING RESTRICTIONS: No  FALLS:  Has patient fallen in last 6 months? No  LIVING ENVIRONMENT: Lives in: House/apartment  OCCUPATION: Retired  PLOF: Independent, yard work, Environmental consultant  PATIENT GOALS: Reduce pain.    OBJECTIVE:   PATIENT SURVEYS:  04/09/2022:  FOTO update 63  03/21/2022 FOTO eval:  70  predicted:  75  SCREENING FOR RED FLAGS: 03/21/2022 Bowel or bladder incontinence: No Cauda equina syndrome: No  COGNITION: 03/21/2022 Overall cognitive status: WFL normal      SENSATION: 03/21/2022 No specific limitation to light touch  MUSCLE LENGTH: 03/21/2022 Unremarkable   POSTURE: 03/21/2022  Mild lateral shift trunk to Lt but easily corrected and gone for the rest of the visit.   PALPATION: 03/21/2022 No specific tenderness in lumbar region to light touch today  LUMBAR ROM:   AROM 03/21/2022 03/27/2022  Flexion Movement to mid shin c pain noted in lumbar Movement to to mid shin c no pain increase  Extension 75% WFL with no specific pain complaints  Repeated x 5 in standing : improved to 100 % c  100% WFL  Right lateral flexion Pain in Rt lumbar   Left lateral flexion Pain in Rt lumbar   Right rotation    Left rotation     (Blank rows = not tested)  LOWER EXTREMITY ROM:      Right 03/21/2022 Left 03/21/2022  Hip flexion    Hip extension    Hip abduction    Hip adduction    Hip internal rotation    Hip external rotation    Knee flexion    Knee extension    Ankle dorsiflexion    Ankle plantarflexion    Ankle inversion    Ankle eversion     (Blank rows = not tested)  MMT:    MMT Right 03/21/2022 Left 03/21/2022 Right 03/21/2022  Left 04/09/2022  Hip flexion 5/5 5/5    Hip extension      Hip abduction      Hip adduction      Hip internal rotation      Hip external rotation      Knee flexion 5/5 5/5    Knee extension 5/5 5/5    Ankle dorsiflexion 5/5 5/5    Ankle plantarflexion Check in future 2+/5 (pain in WB PF) 4/5 (12 single leg reps in standing) 4/5 (10 single leg reps in standing)  Ankle inversion      Ankle eversion       (Blank rows = not tested)  SPECIAL TESTS:  03/21/2022 (-) Slump bilateral  FUNCTIONAL TESTS:  03/21/2022 18 inch chair s UE on 1st   GAIT: 03/21/2022 Independent ambulation  TODAY'S TREATMENT:                                                                             DATE: 04/16/2022  Therex: Recumbent bike 2 6 mins  Incline gastroc stretch 30 sec x 3 bilateral  Standing PF double leg up, single leg lowering eccentrics x 10 bilateral Lateral step down 6 inch 2 x 10 bilateral Sit to stand to sit 18 inch chair c slow lowering focus 2 x 10   Neuro Re-ed Tandem stance on foam 1 min x 1 bilateral SLS c reactive light touching 30 sec x 3 bilateral with SBA to occasional min a to correct balance  TODAY'S TREATMENT:  DATE: 04/09/2022  Therex: Incline gastroc stretch 30 sec x 5 bilateral  Nustep LE only Lvl 5 x 10:30 Standing single leg PF x 10 bilateral  Additional time spent in review of HEP c technique cues and recommendations on sequencing and frequency of use.    Neuro Re-ed Tandem stance x 1 min x 1 bilateral occasional HHA  Wobble board fwd/back light touching x 25 each way occasional HHA SLS c black mat corner touching 4 points x 8 bilaterally  TODAY'S TREATMENT:                                                                             DATE: 04/02/2022  Therex: Nustep LE only Lvl 5 x 6  Standing lumbar extension AROM x 5 Supine lumbar trunk rotation stretch 15 sec x 1 bilateral TrA contractions  in supine with tactile cues x10  Supine clams with Green TB x20  Supine marching green TB x 1 min Supine bridge 3 sec hold x 10 - attempted with reports of immediate pain reported.  Supine hip flexion isometric hold against ipsilateral hand in 90 deg 5 sec hold x 10  Sit to stand x10 from high low mat  Incline gastroc stretch 30 sec x 3 bilateral   PATIENT EDUCATION:  04/09/2022  Education details: HEP update Person educated: Patient Education method: Explanation, Demonstration, Verbal cues, and Handouts Education comprehension: verbalized understanding, returned demonstration, and verbal cues required  HOME EXERCISE PROGRAM: Access Code: [facility] URL: [email or web address] Date: 04/09/2022 Prepared by: Scot Jun  Exercises - Standing Lumbar Extension with Counter  - 3-5 x daily - 7 x weekly - 1 sets - 5-10 reps - Supine Lower Trunk Rotation  - 2-3 x daily - 7 x weekly - 1 sets - 3-5 reps - 15 hold - Supine Piriformis Stretch Pulling Heel to Hip (Mirrored)  - 2-3 x daily - 7 x weekly - 1 sets - 5 reps - 15-30 hold - Supine Bridge  - 1-2 x daily - 7 x weekly - 1-2 sets - 10 reps - 2 hold - Heel Toe Raises with Counter Support  - 1-2 x daily - 7 x weekly - 1-2 sets - 10-15 reps - Gastroc Stretch on Wall  - 2 x daily - 7 x weekly - 1 sets - 5 reps - 30 hold - Hooklying Isometric Hip Flexion  - 1 x daily - 7 x weekly - 1 sets - 10 reps - 5-10 hold - Hooklying Isometric Clamshell  - 1 x daily - 7 x weekly - 3 sets - 10-15 reps - Sit to Stand  - 3 x daily - 7 x weekly - 1 sets - 10 reps  ASSESSMENT:  CLINICAL IMPRESSION: Fair to good balance control on non compliant surface.  Reviewed lifting techniques (golfer's, squat) to avoid excessive loading into lumbar flexion.  Good recall from Pt in demo return.  Continued emphasis on HEP consistency.  .    OBJECTIVE IMPAIRMENTS: decreased activity tolerance, decreased coordination, decreased endurance, decreased mobility,  difficulty walking, decreased ROM, decreased strength, impaired perceived functional ability, impaired flexibility, improper body mechanics, and pain.   ACTIVITY LIMITATIONS: carrying, lifting, bending, sitting, standing, squatting, transfers, bed mobility,  and locomotion level  PARTICIPATION LIMITATIONS: cleaning, laundry, interpersonal relationship, shopping, community activity, and yard work  PERSONAL FACTORS: Time since onset of injury/illness/exacerbation are also affecting patient's functional outcome.   REHAB POTENTIAL: Good  CLINICAL DECISION MAKING: Stable/uncomplicated  EVALUATION COMPLEXITY: Low   GOALS: Goals reviewed with patient? Yes  SHORT TERM GOALS: (target date for Short term goals are 3 weeks 04/11/2022)  1. Patient will demonstrate independent use of home exercise program to maintain progress from in clinic treatments.  Goal status: Met  LONG TERM GOALS: (target dates for all long term goals are 10 weeks  05/30/2022 )   1. Patient will demonstrate/report pain at worst less than or equal to 2/10 to facilitate minimal limitation in daily activity secondary to pain symptoms.  Goal status: on going 04/16/2022   2. Patient will demonstrate independent use of home exercise program to facilitate ability to maintain/progress functional gains from skilled physical therapy services.  Goal status: on going 04/16/2022   3. Patient will demonstrate FOTO outcome > or = 75 % to indicate reduced disability due to condition.  Goal status: on going 04/16/2022   4. Patient will demonstrate lumbar extension 100 % WFL s symptoms to facilitate upright standing, walking posture at PLOF s limitation.  Goal status: on going 04/16/2022   5.  Patient will demonstrate lateral flexion lumbar s symptoms to facilitate mobility at PLOF.   Goal status: on going 04/16/2022   6.  Patient will demonstrate/report ability to perform usual housework/laundry at [facility] s limitation.  Goal status: on  going 04/16/2022   7.  Patient will demonstrate/report ability to perform ambulation s restriction due to symptoms.  Goal Status: on going 04/16/2022  PLAN:  PT FREQUENCY: 1-2x/week  PT DURATION: 10 weeks  PLANNED INTERVENTIONS: Therapeutic exercises, Therapeutic activity, Neuro Muscular re-education, Balance training, Gait training, Patient/Family education, Joint mobilization, Stair training, DME instructions, Dry Needling, Electrical stimulation, Cryotherapy, vasopneumatic device, Moist heat, Taping, Traction Ultrasound, Ionotophoresis '4mg'$ /ml Dexamethasone, and Manual therapy.  All included unless contraindicated  PLAN FOR NEXT SESSION: Possible trial HEP d/c?   Scot Jun, PT, DPT, OCS, ATC 04/16/22  2:19 PM      Referring diagnosis? M54.50,G89.29 (ICD-10-CM) - Chronic midline low back pain without sciatica Treatment diagnosis? (if different than referring diagnosis) M54.59 What was this (referring dx) caused by? '[]'$  Surgery '[]'$  Fall '[x]'$  Ongoing issue '[]'$  Arthritis '[]'$  Other: ____________  Laterality: '[]'$  Rt '[]'$  Lt '[x]'$  Both  Check all possible CPT codes:  *CHOOSE 10 OR LESS*    '[]'$  97110 (Therapeutic Exercise)  '[]'$  92507 (SLP Treatment)  '[]'$  97112 (Neuro Re-ed)   '[]'$  92526 (Swallowing Treatment)   '[]'$  97116 (Gait Training)   '[]'$  [id number] (Cognitive Training, 1st 15 minutes) '[]'$  97140 (Manual Therapy)   '[]'$  97130 (Cognitive Training, each add'l 15 minutes)  '[]'$  97164 (Re-evaluation)                              '[]'$  Other, List CPT Code ____________  '[]'$  [id number] (Therapeutic Activities)     '[]'$  97535 (Self Care)   '[x]'$  All codes above (97110 - 97535)  '[]'$  97012 (Mechanical Traction)  '[x]'$  97014 (E-stim Unattended)  '[]'$  97032 (E-stim manual)  '[]'$  97033 (Ionto)  '[]'$  97035 (Ultrasound) '[x]'$  97750 (Physical Performance Training) '[]'$  [id number] (Aquatic Therapy) '[]'$  97016 (Vasopneumatic Device) '[]'$  [id number] (Paraffin) '[]'$  97034 (Contrast Bath) '[]'$  97597 (Wound Care 1st 20 sq cm) '[]'$  97598 (Wound Care  each add'l 20 sq cm) '[]'$  97760 (Orthotic Fabrication, Fitting, Training Initial) '[]'$  [id number] (Prosthetic Management and Training Initial) '[]'$  [id number] (Orthotic or Prosthetic Training/ Modification Subsequent)   Rudi Heap PT, DPT 04/16/22  2:19 PM '

## 2022-04-23 ENCOUNTER — Ambulatory Visit: Payer: Medicare PPO | Admitting: Rehabilitative and Restorative Service Providers"

## 2022-04-23 ENCOUNTER — Encounter: Payer: Medicare PPO | Admitting: Rehabilitative and Restorative Service Providers"

## 2022-04-23 DIAGNOSIS — M6281 Muscle weakness (generalized): Secondary | ICD-10-CM

## 2022-04-23 DIAGNOSIS — R262 Difficulty in walking, not elsewhere classified: Secondary | ICD-10-CM

## 2022-04-23 DIAGNOSIS — M5459 Other low back pain: Secondary | ICD-10-CM

## 2022-04-23 DIAGNOSIS — M79672 Pain in left foot: Secondary | ICD-10-CM

## 2022-04-23 NOTE — Therapy (Addendum)
OUTPATIENT PHYSICAL THERAPY TREATMENT/ DISCHARGE   Patient Name: Barbara Roman MRN: [id number] DOB:24-Jun-1955, 67 y.o., female Today's Date: 05/29/2022  END OF SESSION:         Past Medical History:  Diagnosis Date   Cervical dysplasia    Dysmenorrhea    Fibroid    Hyperlipidemia    Vitamin D deficiency    Past Surgical History:  Procedure Laterality Date   COLPOSCOPY     FOOT SURGERY     TOTAL VAGINAL HYSTERECTOMY  02/17/1997   TUBAL LIGATION     Patient Active Problem List   Diagnosis Date Noted   Hyperlipidemia 07/06/2018   H/O vitamin D deficiency 12/24/2015   Family history of diabetes mellitus 11/12/2012    PCP: Ronnald Nian, MD  REFERRING PROVIDER: Ronnald Nian, MD  REFERRING DIAG: M54.50,G89.29 (ICD-10-CM) - Chronic midline low back pain without sciatica  Rationale for Evaluation and Treatment: Rehabilitation  THERAPY DIAG:  Other low back pain  Muscle weakness (generalized)  Pain in left foot  Difficulty in walking, not elsewhere classified  ONSET DATE: Over 6 months  SUBJECTIVE:                                                                                                                                                                                           SUBJECTIVE STATEMENT: She indicated feeling less complaints of back.  She indicated a little back pain upon arrival but not like it was.   PERTINENT HISTORY:  Concurrent plantar fasciitis per MD note.  Hyperlipidemia  PAIN:  NPRS scale: 0/10 "sore" Pain location: back, groin bilateral, Lt foot Pain description: sharp,  Aggravating factors: lifting, carrying, bending over, getting up from bending over, initial mobility after inactivity Relieving factors: general movement helps, heating pad, icy hot  PRECAUTIONS: None  WEIGHT BEARING RESTRICTIONS: No  FALLS:  Has patient fallen in last 6 months? No  LIVING ENVIRONMENT: Lives in:  House/apartment  OCCUPATION: Retired  PLOF: Independent, yard work, English as a second language teacher  PATIENT GOALS: Reduce pain.    OBJECTIVE:   PATIENT SURVEYS:  04/23/2022:  FOTO update:  68  04/09/2022:  FOTO update 63  03/21/2022 FOTO eval:  70  predicted:  75  SCREENING FOR RED FLAGS: 03/21/2022 Bowel or bladder incontinence: No Cauda equina syndrome: No  COGNITION: 03/21/2022 Overall cognitive status: WFL normal      SENSATION: 03/21/2022 No specific limitation to light touch  MUSCLE LENGTH: 03/21/2022 Unremarkable   POSTURE: 03/21/2022  Mild lateral shift trunk to Lt but easily corrected and gone for the rest of the visit.   PALPATION: 03/21/2022 No specific tenderness in lumbar region  to light touch today  LUMBAR ROM:   AROM 03/21/2022 03/27/2022 04/23/2022  Flexion Movement to mid shin c pain noted in lumbar Movement to to mid shin c no pain increase   Extension 75% WFL with no specific pain complaints  Repeated x 5 in standing : improved to 100 % c  100% WFL 75 % c mild symptoms   Repeated x 5 in standing improved to 100 %  Right lateral flexion Pain in Rt lumbar    Left lateral flexion Pain in Rt lumbar    Right rotation     Left rotation      (Blank rows = not tested)  LOWER EXTREMITY ROM:      Right 03/21/2022 Left 03/21/2022  Hip flexion    Hip extension    Hip abduction    Hip adduction    Hip internal rotation    Hip external rotation    Knee flexion    Knee extension    Ankle dorsiflexion    Ankle plantarflexion    Ankle inversion    Ankle eversion     (Blank rows = not tested)  MMT:    MMT Right 03/21/2022 Left 03/21/2022 Right 03/21/2022 Left 04/09/2022  Hip flexion 5/5 5/5    Hip extension      Hip abduction      Hip adduction      Hip internal rotation      Hip external rotation      Knee flexion 5/5 5/5    Knee extension 5/5 5/5    Ankle dorsiflexion 5/5 5/5    Ankle plantarflexion Check in future 2+/5 (pain in WB PF) 4/5 (12 single leg reps in standing) 4/5  (10 single leg reps in standing)  Ankle inversion      Ankle eversion       (Blank rows = not tested)  SPECIAL TESTS:  03/21/2022 (-) Slump bilateral  FUNCTIONAL TESTS:  03/21/2022 18 inch chair s UE on 1st   GAIT: 03/21/2022 Independent ambulation  TODAY'S TREATMENT:                                                                             DATE: 04/23/2022  Therex: Recumbent bike 3 8 mins  Incline gastroc stretch 30 sec x 3 bilateral  Standing PF double leg up, single leg lowering eccentrics 2  x 10 bilateral Sit to stand to sit 18 inch chair c slow lowering focus x10  Flight of stairs trial c reciprocal giat pattern   Neuro Re-ed Tandem stance on foam 1 min x 1 bilateral, with head turns x 15 Lt and Rt while on foam   TODAY'S TREATMENT:                                                                             DATE: 04/16/2022  Therex: Recumbent bike 2 6 mins  Incline gastroc stretch 30 sec  x 3 bilateral  Standing PF double leg up, single leg lowering eccentrics x 10 bilateral Lateral step down 6 inch 2 x 10 bilateral Sit to stand to sit 18 inch chair c slow lowering focus 2 x 10   Neuro Re-ed Tandem stance on foam 1 min x 1 bilateral SLS c reactive light touching 30 sec x 3 bilateral with SBA to occasional min a to correct balance  TODAY'S TREATMENT:                                                                             DATE: 04/09/2022  Therex: Incline gastroc stretch 30 sec x 5 bilateral  Nustep LE only Lvl 5 x 10:30 Standing single leg PF x 10 bilateral  Additional time spent in review of HEP c technique cues and recommendations on sequencing and frequency of use.    Neuro Re-ed Tandem stance x 1 min x 1 bilateral occasional HHA  Wobble board fwd/back light touching x 25 each way occasional HHA SLS c black mat corner touching 4 points x 8 bilaterally  TODAY'S TREATMENT:                                                                             DATE:  04/02/2022  Therex: Nustep LE only Lvl 5 x 6  Standing lumbar extension AROM x 5 Supine lumbar trunk rotation stretch 15 sec x 1 bilateral TrA contractions in supine with tactile cues x10  Supine clams with Green TB x20  Supine marching green TB x 1 min Supine bridge 3 sec hold x 10 - attempted with reports of immediate pain reported.  Supine hip flexion isometric hold against ipsilateral hand in 90 deg 5 sec hold x 10  Sit to stand x10 from high low mat  Incline gastroc stretch 30 sec x 3 bilateral   PATIENT EDUCATION:  04/23/2022  Education details: HEP update Person educated: Patient Education method: Programmer, multimedia, Demonstration, Verbal cues, and Handouts Education comprehension: verbalized understanding, returned demonstration, and verbal cues required  HOME EXERCISE PROGRAM: Access Code: [facility] URL: [email or web address] Date: 04/23/2022 Prepared by: Chyrel Masson  Exercises - Standing Lumbar Extension with Counter  - 3-5 x daily - 7 x weekly - 1 sets - 5-10 reps - Supine Lower Trunk Rotation  - 2-3 x daily - 7 x weekly - 1 sets - 3-5 reps - 15 hold - Supine Piriformis Stretch Pulling Heel to Hip (Mirrored)  - 2-3 x daily - 7 x weekly - 1 sets - 5 reps - 15-30 hold - Supine Bridge  - 1-2 x daily - 7 x weekly - 1-2 sets - 10 reps - 2 hold - Heel Toe Raises with Counter Support  - 1-2 x daily - 7 x weekly - 1-2 sets - 10-15 reps - Gastroc Stretch on Wall  - 2 x daily - 7 x weekly - 1 sets - 5 reps -  30 hold - Hooklying Isometric Hip Flexion  - 1 x daily - 7 x weekly - 1 sets - 10 reps - 5-10 hold - Hooklying Isometric Clamshell  - 1 x daily - 7 x weekly - 3 sets - 10-15 reps - Sit to Stand  - 3 x daily - 7 x weekly - 1 sets - 10 reps - Seated Multifidi Isometric  - 1-2 x daily - 7 x weekly - 1 sets - 10 reps - 5-10 hold - Seated Abdominal Press into [employer]  - 1-2 x daily - 7 x weekly - 1 sets - 10 reps - 5-10 hold  ASSESSMENT:  CLINICAL  IMPRESSION: Reviewed cues for consistency and routine of low back related stretching to continue to improve back symptoms.  Pt has attended 6 visits overall during course of treatment, reporting global rating of change at a great deal better +6. Reviewed HEP c good knowledge overall. See objective data for updated information.  Due to subjective improvements, recommend trial HEP at this time.   OBJECTIVE IMPAIRMENTS: decreased activity tolerance, decreased coordination, decreased endurance, decreased mobility, difficulty walking, decreased ROM, decreased strength, impaired perceived functional ability, impaired flexibility, improper body mechanics, and pain.   ACTIVITY LIMITATIONS: carrying, lifting, bending, sitting, standing, squatting, transfers, bed mobility, and locomotion level  PARTICIPATION LIMITATIONS: cleaning, laundry, interpersonal relationship, shopping, community activity, and yard work  PERSONAL FACTORS: Time since onset of injury/illness/exacerbation are also affecting patient's functional outcome.   REHAB POTENTIAL: Good  CLINICAL DECISION MAKING: Stable/uncomplicated  EVALUATION COMPLEXITY: Low   GOALS: Goals reviewed with patient? Yes  SHORT TERM GOALS: (target date for Short term goals are 3 weeks 04/11/2022)  1. Patient will demonstrate independent use of home exercise program to maintain progress from in clinic treatments.  Goal status: Met  LONG TERM GOALS: (target dates for all long term goals are 10 weeks  05/30/2022 )   1. Patient will demonstrate/report pain at worst less than or equal to 2/10 to facilitate minimal limitation in daily activity secondary to pain symptoms.  Goal status: Met 04/23/2022   2. Patient will demonstrate independent use of home exercise program to facilitate ability to maintain/progress functional gains from skilled physical therapy services.  Goal status: Met 04/23/2022   3. Patient will demonstrate FOTO outcome > or = 75 % to  indicate reduced disability due to condition.  Goal status: Met 04/23/2022   4. Patient will demonstrate lumbar extension 100 % WFL s symptoms to facilitate upright standing, walking posture at PLOF s limitation.  Goal status: Met 04/23/2022   5.  Patient will demonstrate lateral flexion lumbar s symptoms to facilitate mobility at PLOF.   Goal status: Met 04/23/2022   6.  Patient will demonstrate/report ability to perform usual housework/laundry at [facility] s limitation.  Goal status: Met 04/23/2022   7.  Patient will demonstrate/report ability to perform ambulation s restriction due to symptoms.  Goal Status: on going  04/23/2022  PLAN:  PT FREQUENCY: 1-2x/week  PT DURATION: 10 weeks  PLANNED INTERVENTIONS: Therapeutic exercises, Therapeutic activity, Neuro Muscular re-education, Balance training, Gait training, Patient/Family education, Joint mobilization, Stair training, DME instructions, Dry Needling, Electrical stimulation, Cryotherapy, vasopneumatic device, Moist heat, Taping, Traction Ultrasound, Ionotophoresis 4mg /ml Dexamethasone, and Manual therapy.  All included unless contraindicated  PLAN FOR NEXT SESSION: Trial HEP, discharge after 30 days inactivity.    Chyrel Masson, PT, DPT, OCS, ATC 05/29/22  8:39 AM   PHYSICAL THERAPY DISCHARGE SUMMARY  Visits from Start of Care:  6  Current functional level related to goals / functional outcomes: See note   Remaining deficits: See note   Education / Equipment: HEP  Patient goals were  mostly met Patient is being discharged due to not returning since the last visit.  Chyrel Masson, PT, DPT, OCS, ATC 05/29/22  8:39 AM    '

## 2022-06-19 DIAGNOSIS — H25812 Combined forms of age-related cataract, left eye: Secondary | ICD-10-CM | POA: Diagnosis not present

## 2022-07-22 ENCOUNTER — Ambulatory Visit (INDEPENDENT_AMBULATORY_CARE_PROVIDER_SITE_OTHER): Payer: Medicare PPO

## 2022-07-22 VITALS — Ht 64.0 in | Wt 142.0 lb

## 2022-07-22 DIAGNOSIS — Z0000 Encounter for general adult medical examination without abnormal findings: Secondary | ICD-10-CM | POA: Diagnosis not present

## 2022-07-22 NOTE — Patient Instructions (Signed)
Barbara Roman , Thank you for taking time to come for your Medicare Wellness Visit. I appreciate your ongoing commitment to your health goals. Please review the following plan we discussed and let me know if I can assist you in the future.   These are the goals we discussed:  Goals      Patient Stated     07/22/2022, wants to start exercising        This is a list of the screening recommended for you and due dates:  Health Maintenance  Topic Date Due   COVID-19 Vaccine (5 - 2023-24 season) 10/18/2021   Flu Shot  09/18/2022   Medicare Annual Wellness Visit  07/22/2023   Mammogram  11/19/2023   Cologuard (Stool DNA test)  08/12/2024   DTaP/Tdap/Td vaccine (2 - Td or Tdap) 10/04/2024   Pneumonia Vaccine  Completed   DEXA scan (bone density measurement)  Completed   Hepatitis C Screening  Completed   Zoster (Shingles) Vaccine  Completed   HPV Vaccine  Aged Out   Colon Cancer Screening  Discontinued    Advanced directives: Please bring a copy of your POA (Power of Attorney) and/or Living Will to your next appointment.   Conditions/risks identified: none  Next appointment: Follow up in one year for your annual wellness visit    Preventive Care 65 Years and Older, Female Preventive care refers to lifestyle choices and visits with your health care provider that can promote health and wellness. What does preventive care include? A yearly physical exam. This is also called an annual well check. Dental exams once or twice a year. Routine eye exams. Ask your health care provider how often you should have your eyes checked. Personal lifestyle choices, including: Daily care of your teeth and gums. Regular physical activity. Eating a healthy diet. Avoiding tobacco and drug use. Limiting alcohol use. Practicing safe sex. Taking low-dose aspirin every day. Taking vitamin and mineral supplements as recommended by your health care provider. What happens during an annual well check? The  services and screenings done by your health care provider during your annual well check will depend on your age, overall health, lifestyle risk factors, and family history of disease. Counseling  Your health care provider may ask you questions about your: Alcohol use. Tobacco use. Drug use. Emotional well-being. Home and relationship well-being. Sexual activity. Eating habits. History of falls. Memory and ability to understand (cognition). Work and work Astronomer. Reproductive health. Screening  You may have the following tests or measurements: Height, weight, and BMI. Blood pressure. Lipid and cholesterol levels. These may be checked every 5 years, or more frequently if you are over 27 years old. Skin check. Lung cancer screening. You may have this screening every year starting at age 75 if you have a 30-pack-year history of smoking and currently smoke or have quit within the past 15 years. Fecal occult blood test (FOBT) of the stool. You may have this test every year starting at age 89. Flexible sigmoidoscopy or colonoscopy. You may have a sigmoidoscopy every 5 years or a colonoscopy every 10 years starting at age 36. Hepatitis C blood test. Hepatitis B blood test. Sexually transmitted disease (STD) testing. Diabetes screening. This is done by checking your blood sugar (glucose) after you have not eaten for a while (fasting). You may have this done every 1-3 years. Bone density scan. This is done to screen for osteoporosis. You may have this done starting at age 71. Mammogram. This may be done every  1-2 years. Talk to your health care provider about how often you should have regular mammograms. Talk with your health care provider about your test results, treatment options, and if necessary, the need for more tests. Vaccines  Your health care provider may recommend certain vaccines, such as: Influenza vaccine. This is recommended every year. Tetanus, diphtheria, and acellular  pertussis (Tdap, Td) vaccine. You may need a Td booster every 10 years. Zoster vaccine. You may need this after age 32. Pneumococcal 13-valent conjugate (PCV13) vaccine. One dose is recommended after age 73. Pneumococcal polysaccharide (PPSV23) vaccine. One dose is recommended after age 53. Talk to your health care provider about which screenings and vaccines you need and how often you need them. This information is not intended to replace advice given to you by your health care provider. Make sure you discuss any questions you have with your health care provider. Document Released: 03/02/2015 Document Revised: 10/24/2015 Document Reviewed: 12/05/2014 Elsevier Interactive Patient Education  2017 [employer].  Fall Prevention in the Home Falls can cause injuries. They can happen to people of all ages. There are many things you can do to make your home safe and to help prevent falls. What can I do on the outside of my home? Regularly fix the edges of walkways and driveways and fix any cracks. Remove anything that might make you trip as you walk through a door, such as a raised step or threshold. Trim any bushes or trees on the path to your home. Use bright outdoor lighting. Clear any walking paths of anything that might make someone trip, such as rocks or tools. Regularly check to see if handrails are loose or broken. Make sure that both sides of any steps have handrails. Any raised decks and porches should have guardrails on the edges. Have any leaves, snow, or ice cleared regularly. Use sand or salt on walking paths during winter. Clean up any spills in your garage right away. This includes oil or grease spills. What can I do in the bathroom? Use night lights. Install grab bars by the toilet and in the tub and shower. Do not use towel bars as grab bars. Use non-skid mats or decals in the tub or shower. If you need to sit down in the shower, use a plastic, non-slip stool. Keep the floor  dry. Clean up any water that spills on the floor as soon as it happens. Remove soap buildup in the tub or shower regularly. Attach bath mats securely with double-sided non-slip rug tape. Do not have throw rugs and other things on the floor that can make you trip. What can I do in the bedroom? Use night lights. Make sure that you have a light by your bed that is easy to reach. Do not use any sheets or blankets that are too big for your bed. They should not hang down onto the floor. Have a firm chair that has side arms. You can use this for support while you get dressed. Do not have throw rugs and other things on the floor that can make you trip. What can I do in the kitchen? Clean up any spills right away. Avoid walking on wet floors. Keep items that you use a lot in easy-to-reach places. If you need to reach something above you, use a strong step stool that has a grab bar. Keep electrical cords out of the way. Do not use floor polish or wax that makes floors slippery. If you must use wax, use non-skid  floor wax. Do not have throw rugs and other things on the floor that can make you trip. What can I do with my stairs? Do not leave any items on the stairs. Make sure that there are handrails on both sides of the stairs and use them. Fix handrails that are broken or loose. Make sure that handrails are as long as the stairways. Check any carpeting to make sure that it is firmly attached to the stairs. Fix any carpet that is loose or worn. Avoid having throw rugs at the top or bottom of the stairs. If you do have throw rugs, attach them to the floor with carpet tape. Make sure that you have a light switch at the top of the stairs and the bottom of the stairs. If you do not have them, ask someone to add them for you. What else can I do to help prevent falls? Wear shoes that: Do not have high heels. Have rubber bottoms. Are comfortable and fit you well. Are closed at the toe. Do not wear  sandals. If you use a stepladder: Make sure that it is fully opened. Do not climb a closed stepladder. Make sure that both sides of the stepladder are locked into place. Ask someone to hold it for you, if possible. Clearly mark and make sure that you can see: Any grab bars or handrails. First and last steps. Where the edge of each step is. Use tools that help you move around (mobility aids) if they are needed. These include: Canes. Walkers. Scooters. Crutches. Turn on the lights when you go into a dark area. Replace any light bulbs as soon as they burn out. Set up your furniture so you have a clear path. Avoid moving your furniture around. If any of your floors are uneven, fix them. If there are any pets around you, be aware of where they are. Review your medicines with your doctor. Some medicines can make you feel dizzy. This can increase your chance of falling. Ask your doctor what other things that you can do to help prevent falls. This information is not intended to replace advice given to you by your health care provider. Make sure you discuss any questions you have with your health care provider. Document Released: 11/30/2008 Document Revised: 07/12/2015 Document Reviewed: 03/10/2014 Elsevier Interactive Patient Education  2017 [employer].

## 2022-07-22 NOTE — Progress Notes (Signed)
I connected with  [name] on 07/22/22 by a audio enabled telemedicine application and verified that I am speaking with the correct person using two identifiers.  Patient Location: Home  Provider Location: Office/Clinic  I discussed the limitations of evaluation and management by telemedicine. The patient expressed understanding and agreed to proceed. Subjective:   Barbara Roman is a 67 y.o. female who presents for Medicare Annual (Subsequent) preventive examination.  Review of Systems     Cardiac Risk Factors include: advanced age (>48men, >28 women);dyslipidemia     Objective:    Today's Vitals   07/22/22 1011  Weight: 142 lb (64.4 kg)  Height: 5\' 4"  (1.626 m)   Body mass index is 24.37 kg/m.     07/22/2022   10:15 AM 03/21/2022   11:15 AM 07/19/2021    9:30 AM  Advanced Directives  Does Patient Have a Medical Advance Directive? Yes Yes Yes  Type of Estate agent of [place];Living will Healthcare Power of Attorney Living will  Does patient want to make changes to medical advance directive?  No - Patient declined No - Patient declined  Copy of Healthcare Power of Attorney in Chart? No - copy requested      Current Medications (verified) Outpatient Encounter Medications as of 07/22/2022  Medication Sig   Ascorbic Acid (VITAMIN C PO) Take by mouth.   BLACK ELDERBERRY PO Take by mouth.   Iron-Vitamins (GERITOL PO) Take by mouth.   VITAMIN D PO Take by mouth.   acyclovir (ZOVIRAX) 800 MG tablet Take 4,000 mg by mouth daily. (Patient not taking: Reported on 03/05/2022)   desonide (DESOWEN) 0.05 % cream APPLY TO FACE UP TO TWICE A DAY AS NEEDED (Patient not taking: Reported on 06/06/2020)   RESTASIS 0.05 % ophthalmic emulsion  (Patient not taking: Reported on 03/05/2022)   triamcinolone cream (KENALOG) 0.1 % Apply 1 application topically 2 (two) times daily. (Patient not taking: Reported on 11/01/2020)   No facility-administered encounter  medications on file as of 07/22/2022.    Allergies (verified) Patient has no known allergies.   History: Past Medical History:  Diagnosis Date   Cervical dysplasia    Dysmenorrhea    Fibroid    Hyperlipidemia    Vitamin D deficiency    Past Surgical History:  Procedure Laterality Date   COLPOSCOPY     FOOT SURGERY     TOTAL VAGINAL HYSTERECTOMY  02/17/1997   TUBAL LIGATION     Family History  Problem Relation Age of Onset   Diabetes Father    Hypertension Father    Heart disease Father    Stroke Father    Hypertension Mother    Stroke Mother    Stroke Sister    Breast cancer Maternal Aunt        Age 71's   Breast cancer Paternal Grandmother        Age 67's   Stroke Brother    Social History   Socioeconomic History   Marital status: Widowed    Spouse name: Not on file   Number of children: Not on file   Years of education: Not on file   Highest education level: Not on file  Occupational History   Occupation: retired  Tobacco Use   Smoking status: Never   Smokeless tobacco: Never  Vaping Use   Vaping Use: Never used  Substance and Sexual Activity   Alcohol use: Yes    Alcohol/week: 0.0 standard drinks of alcohol  Comment: RARE   Drug use: No   Sexual activity: Yes    Birth control/protection: Surgical    Comment: 1ST INTERCOURSE- 19, PARTNERS- 3  Other Topics Concern   Not on file  Social History Narrative   Not on file   Social Determinants of Health   Financial Resource Strain: Low Risk  (07/22/2022)   Overall Financial Resource Strain (CARDIA)    Difficulty of Paying Living Expenses: Not hard at all  Food Insecurity: No Food Insecurity (07/22/2022)   Hunger Vital Sign    Worried About Running Out of Food in the Last Year: Never true    Ran Out of Food in the Last Year: Never true  Transportation Needs: No Transportation Needs (07/22/2022)   PRAPARE - Administrator, Civil Service (Medical): No    Lack of Transportation (Non-Medical):  No  Physical Activity: Inactive (07/22/2022)   Exercise Vital Sign    Days of Exercise per Week: 0 days    Minutes of Exercise per Session: 0 min  Stress: No Stress Concern Present (07/22/2022)   [employer] of Occupational Health - Occupational Stress Questionnaire    Feeling of Stress : Not at all  Social Connections: Not on file    Tobacco Counseling Counseling given: Not Answered   Clinical Intake:  Pre-visit preparation completed: Yes  Pain : No/denies pain     Nutritional Status: BMI of 19-24  Normal Nutritional Risks: None Diabetes: No  How often do you need to have someone help you when you read instructions, pamphlets, or other written materials from your doctor or pharmacy?: 1 - Never  Diabetic? no  Interpreter Needed?: No  Information entered by :: NAllen LPN   Activities of Daily Living    07/22/2022   10:17 AM  In your present state of health, do you have any difficulty performing the following activities:  Hearing? 0  Vision? 1  Comment a little blurry at times  Difficulty concentrating or making decisions? 0  Walking or climbing stairs? 0  Dressing or bathing? 0  Doing errands, shopping? 0  Preparing Food and eating ? N  Using the Toilet? N  In the past six months, have you accidently leaked urine? N  Do you have problems with loss of bowel control? N  Managing your Medications? N  Managing your Finances? N  Housekeeping or managing your Housekeeping? N    Patient Care Team: Ronnald Nian, MD as PCP - General (Family Medicine)  Indicate any recent Medical Services you may have received from other than [facility] providers in the past year (date may be approximate).     Assessment:   This is a routine wellness examination for Barbara Roman.  Hearing/Vision screen Vision Screening - Comments:: Regular eye exams, [facility]  Dietary issues and exercise activities discussed: Current Exercise Habits: The patient does not participate in  regular exercise at present   Goals Addressed             This Visit's Progress    Patient Stated       07/22/2022, wants to start exercising       Depression Screen    07/22/2022   10:16 AM 07/19/2021    9:31 AM 07/10/2020   10:46 AM 06/06/2020    3:33 PM 01/26/2020    3:29 PM 09/28/2019   11:27 AM 07/07/2019   11:04 AM  PHQ 2/9 Scores  PHQ - 2 Score 0 0 0 0 0 0 0  PHQ- 9 Score 0  0        Fall Risk    07/22/2022   10:16 AM 03/05/2022    2:26 PM 07/19/2021    9:30 AM 07/10/2020   10:45 AM 06/06/2020    3:32 PM  Fall Risk   Falls in the past year? 0 0 0 1 1  Number falls in past yr: 0 0 0 0 0  Injury with Fall? 0 0 0 1 0  Comment    right knee bruised left leg  Risk for fall due to : No Fall Risks No Fall Risks No Fall Risks Other (Comment) No Fall Risks  Follow up Falls prevention discussed;Education provided;Falls evaluation completed Falls evaluation completed Falls evaluation completed Falls evaluation completed Falls evaluation completed    FALL RISK PREVENTION PERTAINING TO THE HOME:  Any stairs in or around the home? No  If so, are there any without handrails? N/a Home free of loose throw rugs in walkways, pet beds, electrical cords, etc? Yes  Adequate lighting in your home to reduce risk of falls? Yes   ASSISTIVE DEVICES UTILIZED TO PREVENT FALLS:  Life alert? No  Use of a cane, walker or w/c? No  Grab bars in the bathroom? Yes  Shower chair or bench in shower? No  Elevated toilet seat or a handicapped toilet? Yes   TIMED UP AND GO:  Was the test performed? No .      Cognitive Function:        07/22/2022   10:18 AM 07/19/2021    9:32 AM  6CIT Screen  What Year? 0 points 0 points  What month? 0 points 0 points  What time? 0 points 0 points  Count back from 20 0 points 0 points  Months in reverse 0 points 0 points  Repeat phrase 2 points 0 points  Total Score 2 points 0 points    Immunizations Immunization History  Administered Date(s)  Administered   Fluad Quad(high Dose 65+) 12/09/2021   Influenza,inj,Quad PF,6+ Mos 11/16/2017, 11/23/2018   Influenza-Unspecified 11/21/2016, 12/18/2020   PFIZER(Purple Top)SARS-COV-2 Vaccination 05/24/2019, 06/16/2019, 01/26/2020   PNEUMOCOCCAL CONJUGATE-20 07/19/2021   Pfizer Covid-19 Vaccine Bivalent Booster 50yrs & up 03/19/2021   Tdap 10/05/2014   Zoster Recombinat (Shingrix) 11/21/2016, 01/23/2017    TDAP status: Up to date  Flu Vaccine status: Up to date  Pneumococcal vaccine status: Up to date  Covid-19 vaccine status: Completed vaccines  Qualifies for Shingles Vaccine? Yes   Zostavax completed Yes   Shingrix Completed?: Yes  Screening Tests Health Maintenance  Topic Date Due   COVID-19 Vaccine (5 - 2023-24 season) 10/18/2021   Medicare Annual Wellness (AWV)  07/20/2022   INFLUENZA VACCINE  09/18/2022   MAMMOGRAM  11/19/2023   Fecal DNA (Cologuard)  08/12/2024   DTaP/Tdap/Td (2 - Td or Tdap) 10/04/2024   Pneumonia Vaccine 58+ Years old  Completed   DEXA SCAN  Completed   Hepatitis C Screening  Completed   Zoster Vaccines- Shingrix  Completed   HPV VACCINES  Aged Out   Colonoscopy  Discontinued    Health Maintenance  Health Maintenance Due  Topic Date Due   COVID-19 Vaccine (5 - 2023-24 season) 10/18/2021   Medicare Annual Wellness (AWV)  07/20/2022    Colorectal cancer screening: Type of screening: Cologuard. Completed 08/12/2021. Repeat every 3 years  Mammogram status: Completed 12/09/2021. Repeat every year  Bone Density status: Completed 03/15/2015.   Lung Cancer Screening: (Low Dose CT Chest recommended if Age  55-80 years, 30 pack-year currently smoking OR have quit w/in 15years.) does not qualify.   Lung Cancer Screening Referral: no  Additional Screening:  Hepatitis C Screening: does qualify; Completed 11/12/2012  Vision Screening: Recommended annual ophthalmology exams for early detection of glaucoma and other disorders of the eye. Is the  patient up to date with their annual eye exam?  Yes  Who is the provider or what is the name of the office in which the patient attends annual eye exams? [facility] If pt is not established with a provider, would they like to be referred to a provider to establish care? No .   Dental Screening: Recommended annual dental exams for proper oral hygiene  Community Resource Referral / Chronic Care Management: CRR required this visit?  No   CCM required this visit?  No      Plan:     I have personally reviewed and noted the following in the patient's chart:   Medical and social history Use of alcohol, tobacco or illicit drugs  Current medications and supplements including opioid prescriptions. Patient is not currently taking opioid prescriptions. Functional ability and status Nutritional status Physical activity Advanced directives List of other physicians Hospitalizations, surgeries, and ER visits in previous 12 months Vitals Screenings to include cognitive, depression, and falls Referrals and appointments  In addition, I have reviewed and discussed with patient certain preventive protocols, quality metrics, and best practice recommendations. A written personalized care plan for preventive services as well as general preventive health recommendations were provided to patient.     Barb Merino, LPN   02/22/1094   Nurse Notes: none  Due to this being a virtual visit, the after visit summary with patients personalized plan was offered to patient via mail or my-chart. to pick up at office at next visit

## 2022-07-25 DIAGNOSIS — H25812 Combined forms of age-related cataract, left eye: Secondary | ICD-10-CM | POA: Diagnosis not present

## 2022-08-01 ENCOUNTER — Encounter: Payer: Medicare PPO | Admitting: Family Medicine

## 2022-08-05 ENCOUNTER — Encounter: Payer: Self-pay | Admitting: Family Medicine

## 2022-08-05 ENCOUNTER — Ambulatory Visit: Payer: Medicare PPO | Admitting: Family Medicine

## 2022-08-05 VITALS — BP 122/80 | HR 56 | Temp 98.0°F | Resp 16 | Ht 63.0 in | Wt 151.4 lb

## 2022-08-05 DIAGNOSIS — E785 Hyperlipidemia, unspecified: Secondary | ICD-10-CM | POA: Diagnosis not present

## 2022-08-05 DIAGNOSIS — Z833 Family history of diabetes mellitus: Secondary | ICD-10-CM

## 2022-08-05 DIAGNOSIS — Z8639 Personal history of other endocrine, nutritional and metabolic disease: Secondary | ICD-10-CM | POA: Diagnosis not present

## 2022-08-05 NOTE — Progress Notes (Signed)
Complete physical exam  Patient: Barbara Roman   DOB: Aug 04, 1955   67 y.o. Female  MRN: [id number]  Subjective:    Chief Complaint  Patient presents with   Annual Exam    Had AWV with [facility] on 07/22/2022. Fasting. Also has right shoulder pain x 1 month.     Barbara Roman is a 67 y.o. female who presents today for a complete physical exam. She reports consuming a general diet. The patient does not participate in regular exercise at present. She generally feels fairly well. She reports sleeping fairly well. She does have additional problems to discuss today (right shoulder pain x 1 month). .  She is taking extra vitamin D at approximately 5000 units every other day.  She does use over-the-counter medications.  She is retired but is helping to take care of her mother and her disabled sister.  She is also taking time out of her day to do things for herself.  She was involved in a relationship but apparently that has gone by the wayside.  Otherwise she has no particular concerns or complaints.   Most recent fall risk assessment:    07/22/2022   10:16 AM  Fall Risk   Falls in the past year? 0  Number falls in past yr: 0  Injury with Fall? 0  Risk for fall due to : No Fall Risks  Follow up Falls prevention discussed;Education provided;Falls evaluation completed     Most recent depression screenings:    07/22/2022   10:16 AM 07/19/2021    9:31 AM  PHQ 2/9 Scores  PHQ - 2 Score 0 0  PHQ- 9 Score 0     Vision:Within last year and Dental: Receives regular dental care    Patient Care Team: Ronnald Nian, MD as PCP - General (Family Medicine)   Outpatient Medications Prior to Visit  Medication Sig   Ascorbic Acid (VITAMIN C PO) Take by mouth.   BLACK ELDERBERRY PO Take by mouth.   desonide (DESOWEN) 0.05 % cream    Iron-Vitamins (GERITOL PO) Take by mouth.   RESTASIS 0.05 % ophthalmic emulsion    triamcinolone cream (KENALOG) 0.1 % Apply 1 application topically 2  (two) times daily.   VITAMIN D PO Take by mouth.   acyclovir (ZOVIRAX) 800 MG tablet Take 4,000 mg by mouth daily. (Patient not taking: Reported on 03/05/2022)   No facility-administered medications prior to visit.    Review of Systems  All other systems reviewed and are negative.         Objective:     BP 122/80   Pulse (!) 56   Temp 98 F (36.7 C) (Oral)   Resp 16   Ht 5\' 3"  (1.6 m)   Wt 151 lb 6.4 oz (68.7 kg)   LMP 07/29/1997   SpO2 98% Comment: room air  BMI 26.82 kg/m    Physical Exam  Alert and in no distress. Tympanic membranes and canals are normal. Pharyngeal area is normal. Neck is supple without adenopathy or thyromegaly. Cardiac exam shows a regular sinus rhythm without murmurs or gallops. Lungs are clear to auscultation.      Assessment & Plan:    Routine Health Maintenance and Physical Exam  Immunization History  Administered Date(s) Administered   Fluad Quad(high Dose 65+) 12/09/2021   Influenza,inj,Quad PF,6+ Mos 11/16/2017, 11/23/2018   Influenza-Unspecified 11/21/2016, 12/18/2020   PFIZER(Purple Top)SARS-COV-2 Vaccination 05/24/2019, 06/16/2019, 01/26/2020   PNEUMOCOCCAL CONJUGATE-20 07/19/2021   Pfizer Covid-19 Vaccine  Bivalent Booster 1yrs & up 03/19/2021   Tdap 10/05/2014   Zoster Recombinat (Shingrix) 11/21/2016, 01/23/2017    Health Maintenance  Topic Date Due   COVID-19 Vaccine (5 - 2023-24 season) 10/18/2021   INFLUENZA VACCINE  09/18/2022   Medicare Annual Wellness (AWV)  07/22/2023   MAMMOGRAM  11/19/2023   Fecal DNA (Cologuard)  08/12/2024   DTaP/Tdap/Td (2 - Td or Tdap) 10/04/2024   Pneumonia Vaccine 13+ Years old  Completed   DEXA SCAN  Completed   Hepatitis C Screening  Completed   Zoster Vaccines- Shingrix  Completed   HPV VACCINES  Aged Out   Colonoscopy  Discontinued    Discussed health benefits of physical activity, and encouraged her to engage in regular exercise appropriate for her age and condition.  Problem  List Items Addressed This Visit     Family history of diabetes mellitus   Relevant Orders   CBC with Differential/Platelet   Comprehensive metabolic panel   H/O vitamin D deficiency   Relevant Orders   VITAMIN D 25 Hydroxy (Vit-D Deficiency, Fractures)   Hyperlipidemia - Primary   Relevant Orders   Lipid panel  I encouraged her to continue to take care of herself Follow-up 1 year    Sharlot Gowda, MD

## 2022-08-06 LAB — COMPREHENSIVE METABOLIC PANEL
ALT: 16 IU/L (ref 0–32)
AST: 17 IU/L (ref 0–40)
Albumin: 4.5 g/dL (ref 3.9–4.9)
Alkaline Phosphatase: 120 IU/L (ref 44–121)
BUN/Creatinine Ratio: 14 (ref 12–28)
BUN: 14 mg/dL (ref 8–27)
Bilirubin Total: 0.4 mg/dL (ref 0.0–1.2)
CO2: 22 mmol/L (ref 20–29)
Calcium: 10.5 mg/dL — ABNORMAL HIGH (ref 8.7–10.3)
Chloride: 104 mmol/L (ref 96–106)
Creatinine, Ser: 0.99 mg/dL (ref 0.57–1.00)
Globulin, Total: 2.8 g/dL (ref 1.5–4.5)
Glucose: 86 mg/dL (ref 70–99)
Potassium: 4.1 mmol/L (ref 3.5–5.2)
Sodium: 140 mmol/L (ref 134–144)
Total Protein: 7.3 g/dL (ref 6.0–8.5)
eGFR: 63 mL/min/{1.73_m2} (ref >59)

## 2022-08-06 LAB — CBC WITH DIFFERENTIAL/PLATELET
Basophils Absolute: 0 10*3/uL (ref 0.0–0.2)
Basos: 1 %
EOS (ABSOLUTE): 0.1 10*3/uL (ref 0.0–0.4)
Eos: 1 %
Hematocrit: 41.8 % (ref 34.0–46.6)
Hemoglobin: 13.5 g/dL (ref 11.1–15.9)
Immature Grans (Abs): 0 10*3/uL (ref 0.0–0.1)
Immature Granulocytes: 0 %
Lymphocytes Absolute: 2.3 10*3/uL (ref 0.7–3.1)
Lymphs: 40 %
MCH: 24.8 pg — ABNORMAL LOW (ref 26.6–33.0)
MCHC: 32.3 g/dL (ref 31.5–35.7)
MCV: 77 fL — ABNORMAL LOW (ref 79–97)
Monocytes Absolute: 0.5 10*3/uL (ref 0.1–0.9)
Monocytes: 9 %
Neutrophils Absolute: 2.9 10*3/uL (ref 1.4–7.0)
Neutrophils: 49 %
Platelets: 295 10*3/uL (ref 150–450)
RBC: 5.44 x10E6/uL — ABNORMAL HIGH (ref 3.77–5.28)
RDW: 14.3 % (ref 11.7–15.4)
WBC: 5.8 10*3/uL (ref 3.4–10.8)

## 2022-08-06 LAB — LIPID PANEL
Chol/HDL Ratio: 4.4 ratio (ref 0.0–4.4)
Cholesterol, Total: 270 mg/dL — ABNORMAL HIGH (ref 100–199)
HDL: 62 mg/dL (ref >39)
LDL Chol Calc (NIH): 193 mg/dL — ABNORMAL HIGH (ref 0–99)
Triglycerides: 90 mg/dL (ref 0–149)
VLDL Cholesterol Cal: 15 mg/dL (ref 5–40)

## 2022-08-06 LAB — VITAMIN D 25 HYDROXY (VIT D DEFICIENCY, FRACTURES): Vit D, 25-Hydroxy: 27.9 ng/mL — ABNORMAL LOW (ref 30.0–100.0)

## 2022-08-07 ENCOUNTER — Encounter: Payer: Self-pay | Admitting: Family Medicine

## 2022-08-07 ENCOUNTER — Telehealth (INDEPENDENT_AMBULATORY_CARE_PROVIDER_SITE_OTHER): Payer: Medicare PPO | Admitting: Family Medicine

## 2022-08-07 VITALS — Wt 151.0 lb

## 2022-08-07 DIAGNOSIS — E785 Hyperlipidemia, unspecified: Secondary | ICD-10-CM

## 2022-08-07 MED ORDER — ROSUVASTATIN CALCIUM 40 MG PO TABS: 40.0000 mg | ORAL_TABLET | Freq: Every day | ORAL | 3 refills | Status: DC

## 2022-08-07 NOTE — Progress Notes (Signed)
   Subjective:    Patient ID: Barbara Roman, female    DOB: 1955-09-21, 67 y.o.   MRN: [id number]  HPI Documentation for virtual audio and video telecommunications through Caregility encounter:  The patient was located at home. 2 patient identifiers used.  The provider was located in the office. The patient did consent to this visit and is aware of possible charges through their insurance for this visit. The other persons participating in this telemedicine service were none. Time spent on call was 5 minutes and in review of previous records >15 minutes total for counseling and coordination of care. This virtual service is not related to other E/M service within previous 7 days.  She had recent blood work done which showed an LDL cholesterol above 190.  She has no risk factors for heart disease other than it.  Review of Systems     Objective:   Physical Exam Alert and in no distress otherwise not examined       Assessment & Plan:  Hyperlipidemia, unspecified hyperlipidemia type - Plan: rosuvastatin (CRESTOR) 40 MG tablet I explained her risk of heart disease based on the elevated LDL cholesterol.  Discussed putting her on a statin drug for control not cure.  Discussed possible side effects of the medication.  I will also have her come back in 2 months for nurse visit for cholesterol check.

## 2022-08-08 DIAGNOSIS — H25811 Combined forms of age-related cataract, right eye: Secondary | ICD-10-CM | POA: Diagnosis not present

## 2022-10-07 ENCOUNTER — Ambulatory Visit: Payer: Medicare PPO | Admitting: Family Medicine

## 2022-10-07 ENCOUNTER — Encounter: Payer: Self-pay | Admitting: Family Medicine

## 2022-10-07 ENCOUNTER — Other Ambulatory Visit: Payer: Medicare PPO

## 2022-10-07 VITALS — BP 120/62 | HR 72 | Temp 97.4°F | Resp 16 | Wt 154.0 lb

## 2022-10-07 DIAGNOSIS — M25571 Pain in right ankle and joints of right foot: Secondary | ICD-10-CM

## 2022-10-07 DIAGNOSIS — E7849 Other hyperlipidemia: Secondary | ICD-10-CM | POA: Diagnosis not present

## 2022-10-07 DIAGNOSIS — M25511 Pain in right shoulder: Secondary | ICD-10-CM | POA: Diagnosis not present

## 2022-10-07 LAB — LIPID PANEL
Chol/HDL Ratio: 2.6 ratio (ref 0.0–4.4)
Cholesterol, Total: 135 mg/dL (ref 100–199)
HDL: 52 mg/dL (ref >39)
LDL Chol Calc (NIH): 67 mg/dL (ref 0–99)
Triglycerides: 83 mg/dL (ref 0–149)
VLDL Cholesterol Cal: 16 mg/dL (ref 5–40)

## 2022-10-07 NOTE — Patient Instructions (Signed)
Less take 2 Aleve twice per day for the next 2 weeks minimum.  Regularly

## 2022-10-07 NOTE — Progress Notes (Signed)
   Subjective:    Patient ID: Barbara Roman, female    DOB: [birth year], 67 y.o.   MRN: [id number]  HPI She complains of a several month history of intermittent bilateral ankle weakness.  No history of injury, overuse, other joints being involved.  She has used orthotics with minimal success.  No swelling or deformity but she does state that when she palpates over the talar areas this is somewhat uncomfortable.  She also needs a lipid panel run to assess her statin use.  Towards the end of the encounter he then mentioned difficulty with right shoulder discomfort.  She blames this on an incident several months ago when she was carrying a heavy object and now complains of pain in the biceps area but the pain is made worse with motion of the shoulder.   Review of Systems     Objective:    Physical Exam Exam of the right shoulder shows no palpable tenderness, negative sulcus sign.  Neer's and Hawkins test was difficult to do but she felt uncomfortable with that.  Negative drop arm test.  Exam of both feet shows normal sensation, strength, pulses.  No laxity noted.       Assessment & Plan:  Acute right ankle pain  Acute pain of right shoulder - Plan: Ambulatory referral to Physical Therapy  Other hyperlipidemia - Plan: Lipid panel Dementedly twice per day to help with the foot and ankle.  Explained that at this point there is really no good reason why she should be having feeling of instability in her ankles only.  I will see how she does with physical therapy as well as with the Aleve.

## 2022-10-14 ENCOUNTER — Ambulatory Visit: Payer: Medicare PPO | Admitting: Physical Therapy

## 2022-10-14 ENCOUNTER — Encounter: Payer: Self-pay | Admitting: Physical Therapy

## 2022-10-14 ENCOUNTER — Other Ambulatory Visit: Payer: Self-pay

## 2022-10-14 DIAGNOSIS — M79672 Pain in left foot: Secondary | ICD-10-CM

## 2022-10-14 DIAGNOSIS — R600 Localized edema: Secondary | ICD-10-CM

## 2022-10-14 DIAGNOSIS — M6281 Muscle weakness (generalized): Secondary | ICD-10-CM

## 2022-10-14 DIAGNOSIS — M5459 Other low back pain: Secondary | ICD-10-CM | POA: Diagnosis not present

## 2022-10-14 DIAGNOSIS — M25511 Pain in right shoulder: Secondary | ICD-10-CM

## 2022-10-14 DIAGNOSIS — G8929 Other chronic pain: Secondary | ICD-10-CM

## 2022-10-14 NOTE — Therapy (Signed)
OUTPATIENT PHYSICAL THERAPY SHOULDER EVALUATION   Patient Name: Barbara Roman MRN: [id number] DOB:[birth year], 67 y.o., female Today's Date: 10/14/2022  END OF SESSION:  PT End of Session - 10/14/22 0933     Visit Number 1    Number of Visits 24    Date for PT Re-Evaluation 12/26/22    Progress Note Due on Visit 12    Activity Tolerance Patient tolerated treatment well    Behavior During Therapy [facility] for tasks assessed/performed             Past Medical History:  Diagnosis Date   Cervical dysplasia    Dysmenorrhea    Fibroid    Hyperlipidemia    Vitamin D deficiency    Past Surgical History:  Procedure Laterality Date   COLPOSCOPY     FOOT SURGERY     TOTAL VAGINAL HYSTERECTOMY  02/17/1997   TUBAL LIGATION     Patient Active Problem List   Diagnosis Date Noted   Hyperlipidemia 07/06/2018   H/O vitamin D deficiency 12/24/2015   Family history of diabetes mellitus 11/12/2012    PCP: Ronnald Nian, MD   REFERRING PROVIDER:Lalonde, Everardo All, MD    REFERRING DIAG: M25.511 (ICD-10-CM) - Acute pain of right shoulder   THERAPY DIAG:  Acute pain of right shoulder - Plan: PT plan of care cert/re-cert  Muscle weakness (generalized) - Plan: PT plan of care cert/re-cert  Localized edema - Plan: PT plan of care cert/re-cert  Chronic pain of both ankles - Plan: PT plan of care cert/re-cert  Rationale for Evaluation and Treatment: Rehabilitation  ONSET DATE: March 2024  SUBJECTIVE:                                                                                                                                                                                      SUBJECTIVE STATEMENT: Pt arriving today reporting rt shoulder pain and difficulty with reaching and ADL's.   PERTINENT HISTORY: Cervical dysplasia, partial hysterectomy, bunionectomy left,   PAIN:  NPRS scale: 1/10 Pain location: Rt shoulder Pain description: soreness Aggravating factors:  reaching to opposite shoulder to wash and put on deodorant Relieving factors: change positions  PRECAUTIONS: None  WEIGHT BEARING RESTRICTIONS: No  FALLS:  Has patient fallen in last 6 months? No  LIVING ENVIRONMENT: Lives with: lives with their family and lives alone Lives in: House/apartment Stairs: No Has following equipment at home: None  OCCUPATION: Retired, [employer]  PLOF: Independent  PATIENT GOALS: Be able to perform ADL's with no pain  Next MD visit:   OBJECTIVE:   DIAGNOSTIC FINDINGS:  10/14/22: None found  PATIENT SURVEYS:  10/14/22: FOTO intake:  65%  COGNITION: Overall cognitive status: WFL     SENSATION: WFL  POSTURE: Rounded shoulders and forward head, mild thoracic kyphosis  UPPER EXTREMITY ROM:   ROM Right 10/14/22 Left 10/14/22  Shoulder flexion 140 Painful arc starting at 80 deg 160  Shoulder extension    Shoulder abduction 130 Painful end range 158  Shoulder adduction    Shoulder internal rotation 80 Shoulder abd 45 deg 80 Shoulder abd 45 deg  Shoulder external rotation 60 Shoulder abd 45 deg 75 Shoulder abd 45 deg  Elbow flexion    Elbow extension    Wrist flexion    Wrist extension    Wrist ulnar deviation    Wrist radial deviation    Wrist pronation    Wrist supination    (Blank rows = not tested)  UPPER EXTREMITY MMT:  MMT Right 10/14/22 Left 10/14/22  Shoulder flexion 3 5  Shoulder extension 4 5  Shoulder abduction 3 5  Shoulder adduction    Shoulder internal rotation 4 5  Shoulder external rotation 4 5  Middle trapezius    Lower trapezius    Elbow flexion    Elbow extension    Wrist flexion    Wrist extension    Wrist ulnar deviation    Wrist radial deviation    Wrist pronation    Wrist supination    Grip strength (lbs)    (Blank rows = not tested)   JOINT MOBILITY TESTING:  Tightness in anterior Rt shoulder capsule  PALPATION:  TTP; anterior Rt shoulder, supraspinatus tendon, middle  deltoid                                                                                                                                                                                                  TODAY'S TREATMENT:                                                                                                       DATE: 10/14/22: Therex:    HEP instruction/performance c cues for techniques, handout provided.  Trial set performed of each for comprehension and symptom assessment.  See below for exercise list   PATIENT  EDUCATION: Education details: HEP, POC Person educated: Patient Education method: Solicitor, Verbal cues, and Handouts Education comprehension: verbalized understanding, returned demonstration, and verbal cues required  HOME EXERCISE PROGRAM: Access Code: [email or web address] URL: [email or web address] Date: 10/14/2022 Prepared by: Narda Amber  Exercises - Supine Shoulder Flexion Extension AAROM with Dowel  - 2 x daily - 7 x weekly - 10 reps - 3 seconds hold - Supine Shoulder External Rotation in 45 Degrees Abduction AAROM with Dowel  - 2 x daily - 7 x weekly - 10 reps - 3 seconds hold - Standing Shoulder Row with Anchored Resistance  - 2 x daily - 7 x weekly - 2 sets - 10 reps  ASSESSMENT:  CLINICAL IMPRESSION: Patient is a 67  y.o. who comes to clinic with complaints of primary acute Rt shoulder pain. Pt having difficulty with ADL's and household chores using her Rt UE. Pt also reporting weakness and pain in bilateral ankles. Pt presents with mobility, strength and movement coordination deficits that impair their ability to perform usual daily and recreational functional activities without increase difficulty/symptoms at this time.  Patient to benefit from skilled PT services to address impairments and limitations to improve to previous level of function without restriction secondary to condition.   OBJECTIVE IMPAIRMENTS: decreased mobility,  difficulty walking, decreased ROM, decreased strength, impaired flexibility, impaired UE functional use, and pain.   ACTIVITY LIMITATIONS: lifting, sleeping, bathing, dressing, and hygiene/grooming  PARTICIPATION LIMITATIONS: shopping and community activity  PERSONAL FACTORS:  see pertinent history above  are also affecting patient's functional outcome.   REHAB POTENTIAL: Good  CLINICAL DECISION MAKING: Stable/uncomplicated  EVALUATION COMPLEXITY: Low   GOALS: Goals reviewed with patient? Yes  SHORT TERM GOALS: (target date for Short term goals are 3 weeks 11/07/22)  1.Patient will demonstrate independent use of home exercise program to maintain progress from in clinic treatments. Goal status: New  LONG TERM GOALS: (target dates for all long term goals are 10 weeks  12/26/22 )   1. Patient will demonstrate/report pain at worst less than or equal to 2/10 to facilitate minimal limitation in daily activity secondary to pain symptoms. Goal status: New   2. Patient will demonstrate independent use of home exercise program to facilitate ability to maintain/progress functional gains from skilled physical therapy services. Goal status: New   3. Patient will demonstrate FOTO outcome > or = 69 % to indicate reduced disability due to condition. Goal status: New   4.  Patient will demonstrate Rt UE MMT 5/5 throughout to facilitate lifting, reaching, carrying at [facility] in daily activity.   Goal status: New   5.  Patient will demonstrate Rt GH joint AROM WFL s symptoms to facilitate usual overhead reaching, self care, dressing at PLOF.    Goal status: New   6.  Pt will be able to reach to put on deodorant  and bath under her arm with no pain in Rt UE.  Goal status: New     PLAN:  PT FREQUENCY: 1-2x/week  PT DURATION: 10 weeks  PLANNED INTERVENTIONS: Therapeutic exercises, Therapeutic activity, Neuro Muscular re-education, Balance training, Gait training, Patient/Family education,  Joint mobilization, Stair training, DME instructions, Dry Needling, Electrical stimulation, Traction, Cryotherapy, vasopneumatic device Moist heat, Taping, Ultrasound, Ionotophoresis 4mg /ml Dexamethasone, and aquatic therapy ,Manual therapy.  All included unless contraindicated  PLAN FOR NEXT SESSION:shoulder ROM and strengthening,  Assess bil ankle strength and ROM at next visit. Add goal if appropriate and update HEP as needed     Christie Beckers  Daphine Deutscher, PT, MPT 10/14/2022, 2:45 PM   Referring diagnosis? M25.511 (ICD-10-CM) - Acute pain of right shoulder  Treatment diagnosis? (if different than referring diagnosis) M25.511, M61,81, R60.0, M25.572 What was this (referring dx) caused by? []  Surgery []  Fall [x]  Ongoing issue []  Arthritis []  Other: ____________  Laterality: []  Rt []  Lt []  Both  Check all possible CPT codes:  *CHOOSE 10 OR LESS*    []  97110 (Therapeutic Exercise)  []  25956 (SLP Treatment)  []  97112 (Neuro Re-ed)   []  92526 (Swallowing Treatment)   []  97116 (Gait Training)   []  [id number] (Cognitive Training, 1st 15 minutes) []  97140 (Manual Therapy)   []  97130 (Cognitive Training, each add'l 15 minutes)  []  97164 (Re-evaluation)                              []  Other, List CPT Code ____________  []  97530 (Therapeutic Activities)     []  97535 (Self Care)   [x]  All codes above (97110 - 97535)  []  97012 (Mechanical Traction)  [x]  97014 (E-stim Unattended)  []  97032 (E-stim manual)  []  97033 (Ionto)  []  97035 (Ultrasound) [x]  97750 (Physical Performance Training) []  [id number] (Aquatic Therapy) []  97016 (Vasopneumatic Device) []  [id number] (Paraffin) []  97034 (Contrast Bath) []  97597 (Wound Care 1st 20 sq cm) []  97598 (Wound Care each add'l 20 sq cm) []  97760 (Orthotic Fabrication, Fitting, Training Initial) []  [id number] (Prosthetic Management and Training Initial) []  [id number] (Orthotic or Prosthetic Training/ Modification Subsequent)

## 2022-10-27 ENCOUNTER — Other Ambulatory Visit: Payer: Self-pay | Admitting: Family Medicine

## 2022-10-27 ENCOUNTER — Encounter: Payer: Self-pay | Admitting: Physical Therapy

## 2022-10-27 ENCOUNTER — Ambulatory Visit: Payer: Medicare PPO | Admitting: Physical Therapy

## 2022-10-27 DIAGNOSIS — G8929 Other chronic pain: Secondary | ICD-10-CM

## 2022-10-27 DIAGNOSIS — R600 Localized edema: Secondary | ICD-10-CM

## 2022-10-27 DIAGNOSIS — M6281 Muscle weakness (generalized): Secondary | ICD-10-CM | POA: Diagnosis not present

## 2022-10-27 DIAGNOSIS — Z1231 Encounter for screening mammogram for malignant neoplasm of breast: Secondary | ICD-10-CM

## 2022-10-27 DIAGNOSIS — M25572 Pain in left ankle and joints of left foot: Secondary | ICD-10-CM

## 2022-10-27 DIAGNOSIS — M25511 Pain in right shoulder: Secondary | ICD-10-CM

## 2022-10-27 DIAGNOSIS — M25571 Pain in right ankle and joints of right foot: Secondary | ICD-10-CM | POA: Diagnosis not present

## 2022-10-27 NOTE — Therapy (Signed)
OUTPATIENT PHYSICAL THERAPY SHOULDER EVALUATION   Patient Name: Barbara Roman MRN: [id number] DOB:[birth year], 67 y.o., female Today's Date: 10/27/2022  END OF SESSION:  PT End of Session - 10/27/22 1110     Visit Number 2    Number of Visits 24    Date for PT Re-Evaluation 12/26/22    Progress Note Due on Visit 12    PT Start Time 1017    PT Stop Time 1058    PT Time Calculation (min) 41 min    Activity Tolerance Patient tolerated treatment well    Behavior During Therapy [facility] for tasks assessed/performed              Past Medical History:  Diagnosis Date   Cervical dysplasia    Dysmenorrhea    Fibroid    Hyperlipidemia    Vitamin D deficiency    Past Surgical History:  Procedure Laterality Date   COLPOSCOPY     FOOT SURGERY     TOTAL VAGINAL HYSTERECTOMY  02/17/1997   TUBAL LIGATION     Patient Active Problem List   Diagnosis Date Noted   Hyperlipidemia 07/06/2018   H/O vitamin D deficiency 12/24/2015   Family history of diabetes mellitus 11/12/2012    PCP: Ronnald Nian, MD   REFERRING PROVIDER:Lalonde, Everardo All, MD    REFERRING DIAG: M25.511 (ICD-10-CM) - Acute pain of right shoulder   THERAPY DIAG:  Acute pain of right shoulder  Muscle weakness (generalized)  Localized edema  Chronic pain of both ankles  Rationale for Evaluation and Treatment: Rehabilitation  ONSET DATE: March 2024  SUBJECTIVE:                                                                                                                                                                                      SUBJECTIVE STATEMENT: Pt arriving today reporting 1/10 pain in Rt shoulder. Pt stating right now her ankle pain has resolved and has not been bothering her.   PERTINENT HISTORY: Cervical dysplasia, partial hysterectomy, bunionectomy left,   PAIN:  NPRS scale: 1/10 Pain location: Rt shoulder Pain description: soreness Aggravating factors: reaching to  opposite shoulder to wash and put on deodorant Relieving factors: change positions  PRECAUTIONS: None  WEIGHT BEARING RESTRICTIONS: No  FALLS:  Has patient fallen in last 6 months? No  LIVING ENVIRONMENT: Lives with: lives with their family and lives alone Lives in: House/apartment Stairs: No Has following equipment at home: None  OCCUPATION: Retired, [employer]  PLOF: Independent  PATIENT GOALS: Be able to perform ADL's with no pain  Next MD visit:   OBJECTIVE:   DIAGNOSTIC FINDINGS:  10/14/22: None found  PATIENT SURVEYS:  10/14/22: FOTO intake:    65%  COGNITION: Overall cognitive status: WFL     SENSATION: WFL  POSTURE: Rounded shoulders and forward head, mild thoracic kyphosis  UPPER EXTREMITY ROM:   ROM Right 10/14/22 Left 10/14/22  Shoulder flexion 140 Painful arc starting at 80 deg 160  Shoulder extension    Shoulder abduction 130 Painful end range 158  Shoulder adduction    Shoulder internal rotation 80 Shoulder abd 45 deg 80 Shoulder abd 45 deg  Shoulder external rotation 60 Shoulder abd 45 deg 75 Shoulder abd 45 deg  Elbow flexion    Elbow extension    Wrist flexion    Wrist extension    Wrist ulnar deviation    Wrist radial deviation    Wrist pronation    Wrist supination    (Blank rows = not tested)  UPPER EXTREMITY MMT:  MMT Right 10/14/22 Left 10/14/22  Shoulder flexion 3 5  Shoulder extension 4 5  Shoulder abduction 3 5  Shoulder adduction    Shoulder internal rotation 4 5  Shoulder external rotation 4 5  Middle trapezius    Lower trapezius    Elbow flexion    Elbow extension    Wrist flexion    Wrist extension    Wrist ulnar deviation    Wrist radial deviation    Wrist pronation    Wrist supination    Grip strength (lbs)    (Blank rows = not tested)   JOINT MOBILITY TESTING:  Tightness in anterior Rt shoulder capsule  PALPATION:  TTP; anterior Rt shoulder, supraspinatus tendon, middle deltoid                                                                                                                                                                                                   TODAY'S TREATMENT:                                                                                                       DATE: 10/27/22:  TherEx:  Scifit UE only Level 1.0 2 minutes  each direction Rows: green TB 2 x 10 holding 3 sec Shoulder  Extension: red TB: 2 x 10 holding 3 sec Shoulder IR/ER rotation: red TB 2 x 10  Rolling red physio-ball up and down the wall x 10 holding end range stretch x 3 sec Shoulder stability arm at 90 deg elbow slightly bent x 20 circles each direction Door stretch at 90 degrees: x 3 holding 20 sec    10/14/22: Therex:    HEP instruction/performance c cues for techniques, handout provided.  Trial set performed of each for comprehension and symptom assessment.  See below for exercise list   PATIENT EDUCATION: Education details: HEP, POC Person educated: Patient Education method: Explanation, Demonstration, Verbal cues, and Handouts Education comprehension: verbalized understanding, returned demonstration, and verbal cues required  HOME EXERCISE PROGRAM: Access Code: [email or web address] URL: [email or web address] Date: 10/14/2022 Prepared by: Narda Amber  Exercises - Supine Shoulder Flexion Extension AAROM with Dowel  - 2 x daily - 7 x weekly - 10 reps - 3 seconds hold - Supine Shoulder External Rotation in 45 Degrees Abduction AAROM with Dowel  - 2 x daily - 7 x weekly - 10 reps - 3 seconds hold - Standing Shoulder Row with Anchored Resistance  - 2 x daily - 7 x weekly - 2 sets - 10 reps  ASSESSMENT:  CLINICAL IMPRESSION: Pt tolerating all exercises well for Rt shoulder ROM and strengthening. Pt with no reports of increased pain. Continue skilled PT to maximize pt's function.   OBJECTIVE IMPAIRMENTS: decreased mobility, difficulty walking, decreased ROM,  decreased strength, impaired flexibility, impaired UE functional use, and pain.   ACTIVITY LIMITATIONS: lifting, sleeping, bathing, dressing, and hygiene/grooming  PARTICIPATION LIMITATIONS: shopping and community activity  PERSONAL FACTORS:  see pertinent history above  are also affecting patient's functional outcome.   REHAB POTENTIAL: Good  CLINICAL DECISION MAKING: Stable/uncomplicated  EVALUATION COMPLEXITY: Low   GOALS: Goals reviewed with patient? Yes  SHORT TERM GOALS: (target date for Short term goals are 3 weeks 11/07/22)  1.Patient will demonstrate independent use of home exercise program to maintain progress from in clinic treatments. Goal status: New  LONG TERM GOALS: (target dates for all long term goals are 10 weeks  12/26/22 )   1. Patient will demonstrate/report pain at worst less than or equal to 2/10 to facilitate minimal limitation in daily activity secondary to pain symptoms. Goal status: New   2. Patient will demonstrate independent use of home exercise program to facilitate ability to maintain/progress functional gains from skilled physical therapy services. Goal status: New   3. Patient will demonstrate FOTO outcome > or = 69 % to indicate reduced disability due to condition. Goal status: New   4.  Patient will demonstrate Rt UE MMT 5/5 throughout to facilitate lifting, reaching, carrying at [facility] in daily activity.   Goal status: New   5.  Patient will demonstrate Rt GH joint AROM WFL s symptoms to facilitate usual overhead reaching, self care, dressing at PLOF.    Goal status: New   6.  Pt will be able to reach to put on deodorant  and bath under her arm with no pain in Rt UE.  Goal status: New     PLAN:  PT FREQUENCY: 1-2x/week  PT DURATION: 10 weeks  PLANNED INTERVENTIONS: Therapeutic exercises, Therapeutic activity, Neuro Muscular re-education, Balance training, Gait training, Patient/Family education, Joint mobilization, Stair training,  DME instructions, Dry Needling, Electrical stimulation, Traction, Cryotherapy, vasopneumatic device Moist heat, Taping, Ultrasound, Ionotophoresis 4mg /ml Dexamethasone, and aquatic therapy ,Manual therapy.  All included unless contraindicated  PLAN FOR NEXT  SESSION:shoulder ROM and strengthening,  Assess bil ankle strength and ROM if appropriate and update HEP as needed     Sharmon Leyden, PT, MPT 10/27/2022, 11:11 AM   Referring diagnosis? M25.511 (ICD-10-CM) - Acute pain of right shoulder  Treatment diagnosis? (if different than referring diagnosis) M25.511, M61,81, R60.0, M25.572 What was this (referring dx) caused by? []  Surgery []  Fall [x]  Ongoing issue []  Arthritis []  Other: ____________  Laterality: []  Rt []  Lt []  Both  Check all possible CPT codes:  *CHOOSE 10 OR LESS*    []  97110 (Therapeutic Exercise)  []  92507 (SLP Treatment)  []  97112 (Neuro Re-ed)   []  92526 (Swallowing Treatment)   []  40981 (Gait Training)   []  [id number] (Cognitive Training, 1st 15 minutes) []  97140 (Manual Therapy)   []  97130 (Cognitive Training, each add'l 15 minutes)  []  97164 (Re-evaluation)                              []  Other, List CPT Code ____________  []  97530 (Therapeutic Activities)     []  97535 (Self Care)   [x]  All codes above (97110 - 97535)  []  97012 (Mechanical Traction)  [x]  97014 (E-stim Unattended)  []  97032 (E-stim manual)  []  97033 (Ionto)  []  97035 (Ultrasound) [x]  97750 (Physical Performance Training) []  [id number] (Aquatic Therapy) []  97016 (Vasopneumatic Device) []  [id number] (Paraffin) []  97034 (Contrast Bath) []  97597 (Wound Care 1st 20 sq cm) []  97598 (Wound Care each add'l 20 sq cm) []  97760 (Orthotic Fabrication, Fitting, Training Initial) []  [id number] (Prosthetic Management and Training Initial) []  [id number] (Orthotic or Prosthetic Training/ Modification Subsequent)

## 2022-11-04 ENCOUNTER — Ambulatory Visit: Payer: Medicare PPO | Admitting: Physical Therapy

## 2022-11-04 ENCOUNTER — Encounter: Payer: Self-pay | Admitting: Physical Therapy

## 2022-11-04 DIAGNOSIS — R600 Localized edema: Secondary | ICD-10-CM

## 2022-11-04 DIAGNOSIS — M25511 Pain in right shoulder: Secondary | ICD-10-CM

## 2022-11-04 DIAGNOSIS — M25571 Pain in right ankle and joints of right foot: Secondary | ICD-10-CM | POA: Diagnosis not present

## 2022-11-04 DIAGNOSIS — M6281 Muscle weakness (generalized): Secondary | ICD-10-CM | POA: Diagnosis not present

## 2022-11-04 DIAGNOSIS — G8929 Other chronic pain: Secondary | ICD-10-CM

## 2022-11-04 DIAGNOSIS — M25572 Pain in left ankle and joints of left foot: Secondary | ICD-10-CM | POA: Diagnosis not present

## 2022-11-04 NOTE — Therapy (Signed)
OUTPATIENT PHYSICAL THERAPY SHOULDER   Patient Name: Marshay Buttry MRN: [id number] DOB:[birth year], 67 y.o., female Today's Date: 11/04/2022  END OF SESSION:  PT End of Session - 11/04/22 1227     Visit Number 3    Number of Visits 24    Date for PT Re-Evaluation 12/26/22    Progress Note Due on Visit 12    PT Start Time 0939    PT Stop Time 1015    PT Time Calculation (min) 36 min    Activity Tolerance Patient tolerated treatment well    Behavior During Therapy [facility] for tasks assessed/performed               Past Medical History:  Diagnosis Date   Cervical dysplasia    Dysmenorrhea    Fibroid    Hyperlipidemia    Vitamin D deficiency    Past Surgical History:  Procedure Laterality Date   COLPOSCOPY     FOOT SURGERY     TOTAL VAGINAL HYSTERECTOMY  02/17/1997   TUBAL LIGATION     Patient Active Problem List   Diagnosis Date Noted   Hyperlipidemia 07/06/2018   H/O vitamin D deficiency 12/24/2015   Family history of diabetes mellitus 11/12/2012    PCP: Ronnald Nian, MD   REFERRING PROVIDER:Lalonde, Everardo All, MD    REFERRING DIAG: M25.511 (ICD-10-CM) - Acute pain of right shoulder   THERAPY DIAG:  Acute pain of right shoulder  Muscle weakness (generalized)  Localized edema  Chronic pain of both ankles  Rationale for Evaluation and Treatment: Rehabilitation  ONSET DATE: March 2024  SUBJECTIVE:                                                                                                                                                                                      SUBJECTIVE STATEMENT: Pt arriving today reporting 3/10 pain in her Rt shoulder. After lifting 3# vacuum up and down from her attic.   PERTINENT HISTORY: Cervical dysplasia, partial hysterectomy, bunionectomy left,   PAIN:  NPRS scale: 3/10 Pain location: Rt shoulder Pain description: soreness Aggravating factors: reaching to opposite shoulder to wash and put on  deodorant Relieving factors: change positions  PRECAUTIONS: None  WEIGHT BEARING RESTRICTIONS: No  FALLS:  Has patient fallen in last 6 months? No  LIVING ENVIRONMENT: Lives with: lives with their family and lives alone Lives in: House/apartment Stairs: No Has following equipment at home: None  OCCUPATION: Retired, [employer]  PLOF: Independent  PATIENT GOALS: Be able to perform ADL's with no pain  Next MD visit:   OBJECTIVE:   DIAGNOSTIC FINDINGS:  10/14/22: None found  PATIENT SURVEYS:  10/14/22: FOTO intake:    65%  COGNITION: Overall cognitive status: WFL     SENSATION: WFL  POSTURE: Rounded shoulders and forward head, mild thoracic kyphosis  UPPER EXTREMITY ROM:   ROM Right 10/14/22 Left 10/14/22  Shoulder flexion 140 Painful arc starting at 80 deg 160  Shoulder extension    Shoulder abduction 130 Painful end range 158  Shoulder adduction    Shoulder internal rotation 80 Shoulder abd 45 deg 80 Shoulder abd 45 deg  Shoulder external rotation 60 Shoulder abd 45 deg 75 Shoulder abd 45 deg  Elbow flexion    Elbow extension    Wrist flexion    Wrist extension    Wrist ulnar deviation    Wrist radial deviation    Wrist pronation    Wrist supination    (Blank rows = not tested)  UPPER EXTREMITY MMT:  MMT Right 10/14/22 Left 10/14/22  Shoulder flexion 3 5  Shoulder extension 4 5  Shoulder abduction 3 5  Shoulder adduction    Shoulder internal rotation 4 5  Shoulder external rotation 4 5  Middle trapezius    Lower trapezius    Elbow flexion    Elbow extension    Wrist flexion    Wrist extension    Wrist ulnar deviation    Wrist radial deviation    Wrist pronation    Wrist supination    Grip strength (lbs)    (Blank rows = not tested)   JOINT MOBILITY TESTING:  Tightness in anterior Rt shoulder capsule  PALPATION:  TTP; anterior Rt shoulder, supraspinatus tendon, middle deltoid                                                                                                                                                                                                   TODAY'S TREATMENT:                                                                                                       DATE: 11/04/22:  TherEx:  Pulleys: 2 minutes flexion and abduction UE ranger: x 10 flexion, x 10 scaption Rolling ball up and down the wall x 10  holding end range x 4-5 seconds Rows: green TB 2 x 10 holding 3 sec Shoulder Extension: red TB: 2 x 10 holding 3 sec Shoulder IR/ER rotation: red TB 2 x 10  AAROM shoulder abd c 2 # bar x 10 holding end range 2-3 seconds Prone: Ys and Ws: each x 10 bil UEs Side lying: Rt shoulder abd: x 10 c 2#  Side lying: Rt shoulder ER x 10 c 2 #    10/27/22:  TherEx:  Scifit UE only Level 1.0 2 minutes  each direction Rows: green TB 2 x 10 holding 3 sec Shoulder Extension: red TB: 2 x 10 holding 3 sec Shoulder IR/ER rotation: red TB 2 x 10  Rolling red physio-ball up and down the wall x 10 holding end range stretch x 3 sec Shoulder stability arm at 90 deg elbow slightly bent x 20 circles each direction Door stretch at 90 degrees: x 3 holding 20 sec    10/14/22: Therex:    HEP instruction/performance c cues for techniques, handout provided.  Trial set performed of each for comprehension and symptom assessment.  See below for exercise list   PATIENT EDUCATION: Education details: HEP, POC Person educated: Patient Education method: Explanation, Demonstration, Verbal cues, and Handouts Education comprehension: verbalized understanding, returned demonstration, and verbal cues required  HOME EXERCISE PROGRAM: Access Code: [email or web address] URL: [email or web address] Date: 10/14/2022 Prepared by: Narda Amber  Exercises - Supine Shoulder Flexion Extension AAROM with Dowel  - 2 x daily - 7 x weekly - 10 reps - 3 seconds hold - Supine Shoulder External Rotation in 45 Degrees Abduction  AAROM with Dowel  - 2 x daily - 7 x weekly - 10 reps - 3 seconds hold - Standing Shoulder Row with Anchored Resistance  - 2 x daily - 7 x weekly - 2 sets - 10 reps  ASSESSMENT:  CLINICAL IMPRESSION: Pt arriving today reporting 3/10 pain in her Rt shoulder. Pt able to tolerate all exercises well. Pt instructed to ice as needed for pain. Continue to progress toward pt's LTG's set.   OBJECTIVE IMPAIRMENTS: decreased mobility, difficulty walking, decreased ROM, decreased strength, impaired flexibility, impaired UE functional use, and pain.   ACTIVITY LIMITATIONS: lifting, sleeping, bathing, dressing, and hygiene/grooming  PARTICIPATION LIMITATIONS: shopping and community activity  PERSONAL FACTORS:  see pertinent history above  are also affecting patient's functional outcome.   REHAB POTENTIAL: Good  CLINICAL DECISION MAKING: Stable/uncomplicated  EVALUATION COMPLEXITY: Low   GOALS: Goals reviewed with patient? Yes  SHORT TERM GOALS: (target date for Short term goals are 3 weeks 11/07/22)  1.Patient will demonstrate independent use of home exercise program to maintain progress from in clinic treatments. Goal status: MET 11/04/22  LONG TERM GOALS: (target dates for all long term goals are 10 weeks  12/26/22 )   1. Patient will demonstrate/report pain at worst less than or equal to 2/10 to facilitate minimal limitation in daily activity secondary to pain symptoms. Goal status:On-going 11/04/22   2. Patient will demonstrate independent use of home exercise program to facilitate ability to maintain/progress functional gains from skilled physical therapy services. Goal status: New   3. Patient will demonstrate FOTO outcome > or = 69 % to indicate reduced disability due to condition. Goal status: New   4.  Patient will demonstrate Rt UE MMT 5/5 throughout to facilitate lifting, reaching, carrying at [facility] in daily activity.   Goal status: New   5.  Patient will demonstrate Rt GH joint  AROM WFL s  symptoms to facilitate usual overhead reaching, self care, dressing at PLOF.    Goal status: New   6.  Pt will be able to reach to put on deodorant  and bath under her arm with no pain in Rt UE.  Goal status: New     PLAN:  PT FREQUENCY: 1-2x/week  PT DURATION: 10 weeks  PLANNED INTERVENTIONS: Therapeutic exercises, Therapeutic activity, Neuro Muscular re-education, Balance training, Gait training, Patient/Family education, Joint mobilization, Stair training, DME instructions, Dry Needling, Electrical stimulation, Traction, Cryotherapy, vasopneumatic device Moist heat, Taping, Ultrasound, Ionotophoresis 4mg /ml Dexamethasone, and aquatic therapy ,Manual therapy.  All included unless contraindicated  PLAN FOR NEXT SESSION:shoulder ROM and strengthening, scapular stabilization       Sharmon Leyden, PT, MPT 11/04/2022, 12:30 PM   Referring diagnosis? M25.511 (ICD-10-CM) - Acute pain of right shoulder  Treatment diagnosis? (if different than referring diagnosis) M25.511, M61,81, R60.0, M25.572 What was this (referring dx) caused by? []  Surgery []  Fall [x]  Ongoing issue []  Arthritis []  Other: ____________  Laterality: []  Rt []  Lt []  Both  Check all possible CPT codes:  *CHOOSE 10 OR LESS*    []  97110 (Therapeutic Exercise)  []  92507 (SLP Treatment)  []  97112 (Neuro Re-ed)   []  92526 (Swallowing Treatment)   []  16109 (Gait Training)   []  [id number] (Cognitive Training, 1st 15 minutes) []  97140 (Manual Therapy)   []  97130 (Cognitive Training, each add'l 15 minutes)  []  97164 (Re-evaluation)                              []  Other, List CPT Code ____________  []  97530 (Therapeutic Activities)     []  97535 (Self Care)   [x]  All codes above (97110 - 97535)  []  97012 (Mechanical Traction)  [x]  97014 (E-stim Unattended)  []  97032 (E-stim manual)  []  97033 (Ionto)  []  97035 (Ultrasound) [x]  97750 (Physical Performance Training) []  60454 (Aquatic Therapy) []  97016  (Vasopneumatic Device) []  [id number] (Paraffin) []  97034 (Contrast Bath) []  97597 (Wound Care 1st 20 sq cm) []  97598 (Wound Care each add'l 20 sq cm) []  97760 (Orthotic Fabrication, Fitting, Training Initial) []  [id number] (Prosthetic Management and Training Initial) []  [id number] (Orthotic or Prosthetic Training/ Modification Subsequent)

## 2022-11-11 ENCOUNTER — Encounter: Payer: Self-pay | Admitting: Physical Therapy

## 2022-11-11 ENCOUNTER — Ambulatory Visit: Payer: Medicare PPO | Admitting: Physical Therapy

## 2022-11-11 DIAGNOSIS — R600 Localized edema: Secondary | ICD-10-CM

## 2022-11-11 DIAGNOSIS — M6281 Muscle weakness (generalized): Secondary | ICD-10-CM

## 2022-11-11 DIAGNOSIS — M25511 Pain in right shoulder: Secondary | ICD-10-CM | POA: Diagnosis not present

## 2022-11-11 NOTE — Therapy (Signed)
OUTPATIENT PHYSICAL THERAPY SHOULDER   Patient Name: Clovia Durnin MRN: [id number] DOB:05-29-1955, 67 y.o., female Today's Date: 11/11/2022  END OF SESSION:  PT End of Session - 11/11/22 0938     Visit Number 4    Number of Visits 24    Date for PT Re-Evaluation 12/26/22    Progress Note Due on Visit 12    PT Start Time 0934    PT Stop Time 1014    PT Time Calculation (min) 40 min    Activity Tolerance Patient tolerated treatment well    Behavior During Therapy [facility] for tasks assessed/performed               Past Medical History:  Diagnosis Date   Cervical dysplasia    Dysmenorrhea    Fibroid    Hyperlipidemia    Vitamin D deficiency    Past Surgical History:  Procedure Laterality Date   COLPOSCOPY     FOOT SURGERY     TOTAL VAGINAL HYSTERECTOMY  02/17/1997   TUBAL LIGATION     Patient Active Problem List   Diagnosis Date Noted   Hyperlipidemia 07/06/2018   H/O vitamin D deficiency 12/24/2015   Family history of diabetes mellitus 11/12/2012    PCP: Ronnald Nian, MD   REFERRING PROVIDER:Lalonde, Everardo All, MD    REFERRING DIAG: M25.511 (ICD-10-CM) - Acute pain of right shoulder   THERAPY DIAG:  Acute pain of right shoulder  Muscle weakness (generalized)  Localized edema  Rationale for Evaluation and Treatment: Rehabilitation  ONSET DATE: March 2024  SUBJECTIVE:                                                                                                                                                                                      SUBJECTIVE STATEMENT: Pt reporting more bicep soreness today.   PERTINENT HISTORY: Cervical dysplasia, partial hysterectomy, bunionectomy left,   PAIN:  NPRS scale: 1-2/10 Pain location: Rt shoulder Pain description: soreness Aggravating factors: reaching to opposite shoulder to wash and put on deodorant Relieving factors: change positions  PRECAUTIONS: None  WEIGHT BEARING RESTRICTIONS:  No  FALLS:  Has patient fallen in last 6 months? No  LIVING ENVIRONMENT: Lives with: lives with their family and lives alone Lives in: House/apartment Stairs: No Has following equipment at home: None  OCCUPATION: Retired, [employer]  PLOF: Independent  PATIENT GOALS: Be able to perform ADL's with no pain  Next MD visit:   OBJECTIVE:   DIAGNOSTIC FINDINGS:  10/14/22: None found  PATIENT SURVEYS:  10/14/22: FOTO intake:    65% 11/11/22: FOTO update: 69%  COGNITION: Overall cognitive status: [facility]  SENSATION: WFL  POSTURE: Rounded shoulders and forward head, mild thoracic kyphosis  UPPER EXTREMITY ROM:   ROM Right 10/14/22 Left 10/14/22 Rt 11/11/22  Shoulder flexion 140 Painful arc starting at 80 deg 160 156  Shoulder extension     Shoulder abduction 130 Painful end range 158 160  Shoulder adduction     Shoulder internal rotation 80 Shoulder abd 45 deg 80 Shoulder abd 45 deg 80 Shoulder abd 45 deg  Shoulder external rotation 60 Shoulder abd 45 deg 75 Shoulder abd 45 deg 70 Shoulder abd 45 deg  Elbow flexion     Elbow extension     Wrist flexion     Wrist extension     Wrist ulnar deviation     Wrist radial deviation     Wrist pronation     Wrist supination     (Blank rows = not tested)  UPPER EXTREMITY MMT:  MMT Right 10/14/22 Left 10/14/22  Shoulder flexion 3 5  Shoulder extension 4 5  Shoulder abduction 3 5  Shoulder adduction    Shoulder internal rotation 4 5  Shoulder external rotation 4 5  Middle trapezius    Lower trapezius    Elbow flexion    Elbow extension    Wrist flexion    Wrist extension    Wrist ulnar deviation    Wrist radial deviation    Wrist pronation    Wrist supination    Grip strength (lbs)    (Blank rows = not tested)   JOINT MOBILITY TESTING:  Tightness in anterior Rt shoulder capsule  PALPATION:  TTP; anterior Rt shoulder, supraspinatus tendon, middle deltoid                                                                                                                                                                                                   TODAY'S TREATMENT:                                                                                                       DATE: 11/11/22:  TherEx:  Pulleys: 2 minutes flexion and abduction Rolling 2 #  ball up and down the wall  x 10  Rows: green TB 2 x 10 holding 3 sec Standing shoulder stability with 2# ball on wall circles both directions Shoulder IR/ER rotation: red TB 2 x 10  Prone: Ys and Ws: each x 10 bil UEs Side lying: Rt shoulder abd: x 10 c 1#  Side lying: Rt shoulder ER x 10 c 2 #  Side lying shoulder stability exercises circles both directions x 20 Rt shoulder in 90 deg abduction Door stretch: 90 deg x 2 holding 30 sec Door stretch: 30 deg x 2 holding 30 sec  ROM measurements performed see above FOTO update: 69%    11/04/22:  TherEx:  Pulleys: 2 minutes flexion and abduction UE ranger: x 10 flexion, x 10 scaption Rolling ball up and down the wall x 10 holding end range x 4-5 seconds Rows: green TB 2 x 10 holding 3 sec Shoulder Extension: red TB: 2 x 10 holding 3 sec Shoulder IR/ER rotation: red TB 2 x 10  AAROM shoulder abd c 2 # bar x 10 holding end range 2-3 seconds Prone: Ys and Ws: each x 10 bil UEs Side lying: Rt shoulder abd: x 10 c 2#  Side lying: Rt shoulder ER x 10 c 2 #    10/27/22:  TherEx:  Scifit UE only Level 1.0 2 minutes  each direction Rows: green TB 2 x 10 holding 3 sec Shoulder Extension: red TB: 2 x 10 holding 3 sec Shoulder IR/ER rotation: red TB 2 x 10  Rolling red physio-ball up and down the wall x 10 holding end range stretch x 3 sec Shoulder stability arm at 90 deg elbow slightly bent x 20 circles each direction Door stretch at 90 degrees: x 3 holding 20 sec      PATIENT EDUCATION: Education details: HEP, POC Person educated: Patient Education method: Programmer, multimedia, Facilities manager,  Verbal cues, and Handouts Education comprehension: verbalized understanding, returned demonstration, and verbal cues required  HOME EXERCISE PROGRAM: Access Code: [email or web address] URL: [email or web address] Date: 10/14/2022 Prepared by: Narda Amber  Exercises - Supine Shoulder Flexion Extension AAROM with Dowel  - 2 x daily - 7 x weekly - 10 reps - 3 seconds hold - Supine Shoulder External Rotation in 45 Degrees Abduction AAROM with Dowel  - 2 x daily - 7 x weekly - 10 reps - 3 seconds hold - Standing Shoulder Row with Anchored Resistance  - 2 x daily - 7 x weekly - 2 sets - 10 reps  ASSESSMENT:  CLINICAL IMPRESSION: Pt arriving today reporting 1-2/10 pain. Pt has made progress in her active ROM since beginning therapy. Pt's FOTO score improved to 69%. Continue pt's POC toward LTG's set.   OBJECTIVE IMPAIRMENTS: decreased mobility, difficulty walking, decreased ROM, decreased strength, impaired flexibility, impaired UE functional use, and pain.   ACTIVITY LIMITATIONS: lifting, sleeping, bathing, dressing, and hygiene/grooming  PARTICIPATION LIMITATIONS: shopping and community activity  PERSONAL FACTORS:  see pertinent history above  are also affecting patient's functional outcome.   REHAB POTENTIAL: Good  CLINICAL DECISION MAKING: Stable/uncomplicated  EVALUATION COMPLEXITY: Low   GOALS: Goals reviewed with patient? Yes  SHORT TERM GOALS: (target date for Short term goals are 3 weeks 11/07/22)  1.Patient will demonstrate independent use of home exercise program to maintain progress from in clinic treatments. Goal status: MET 11/04/22  LONG TERM GOALS: (target dates for all long term goals are 10 weeks  12/26/22 )   1. Patient will demonstrate/report pain at worst less than or equal to 2/10 to facilitate minimal  limitation in daily activity secondary to pain symptoms. Goal status:On-going 11/04/22   2. Patient will demonstrate independent use of home exercise  program to facilitate ability to maintain/progress functional gains from skilled physical therapy services. Goal status: New   3. Patient will demonstrate FOTO outcome > or = 69 % to indicate reduced disability due to condition. Goal status: MET 11/11/22   4.  Patient will demonstrate Rt UE MMT 5/5 throughout to facilitate lifting, reaching, carrying at [facility] in daily activity.   Goal status: New   5.  Patient will demonstrate Rt GH joint AROM WFL s symptoms to facilitate usual overhead reaching, self care, dressing at PLOF.    Goal status: New   6.  Pt will be able to reach to put on deodorant  and bath under her arm with no pain in Rt UE.  Goal status: New     PLAN:  PT FREQUENCY: 1-2x/week  PT DURATION: 10 weeks  PLANNED INTERVENTIONS: Therapeutic exercises, Therapeutic activity, Neuro Muscular re-education, Balance training, Gait training, Patient/Family education, Joint mobilization, Stair training, DME instructions, Dry Needling, Electrical stimulation, Traction, Cryotherapy, vasopneumatic device Moist heat, Taping, Ultrasound, Ionotophoresis 4mg /ml Dexamethasone, and aquatic therapy ,Manual therapy.  All included unless contraindicated  PLAN FOR NEXT SESSION:shoulder ROM and strengthening, scapular stabilization       Sharmon Leyden, PT, MPT 11/11/2022, 10:16 AM   Referring diagnosis? M25.511 (ICD-10-CM) - Acute pain of right shoulder  Treatment diagnosis? (if different than referring diagnosis) M25.511, M61,81, R60.0, M25.572 What was this (referring dx) caused by? []  Surgery []  Fall [x]  Ongoing issue []  Arthritis []  Other: ____________  Laterality: []  Rt []  Lt []  Both  Check all possible CPT codes:  *CHOOSE 10 OR LESS*    []  97110 (Therapeutic Exercise)  []  92507 (SLP Treatment)  []  97112 (Neuro Re-ed)   []  92526 (Swallowing Treatment)   []  97116 (Gait Training)   []  [id number] (Cognitive Training, 1st 15 minutes) []  97140 (Manual Therapy)   []  97130  (Cognitive Training, each add'l 15 minutes)  []  97164 (Re-evaluation)                              []  Other, List CPT Code ____________  []  97530 (Therapeutic Activities)     []  97535 (Self Care)   [x]  All codes above (97110 - 97535)  []  97012 (Mechanical Traction)  [x]  97014 (E-stim Unattended)  []  97032 (E-stim manual)  []  97033 (Ionto)  []  97035 (Ultrasound) [x]  28413 (Physical Performance Training) []  [id number] (Aquatic Therapy) []  97016 (Vasopneumatic Device) []  [id number] (Paraffin) []  97034 (Contrast Bath) []  97597 (Wound Care 1st 20 sq cm) []  97598 (Wound Care each add'l 20 sq cm) []  97760 (Orthotic Fabrication, Fitting, Training Initial) []  [id number] (Prosthetic Management and Training Initial) []  [id number] (Orthotic or Prosthetic Training/ Modification Subsequent)

## 2022-11-18 ENCOUNTER — Ambulatory Visit: Payer: Medicare PPO | Admitting: Physical Therapy

## 2022-11-18 ENCOUNTER — Encounter: Payer: Self-pay | Admitting: Physical Therapy

## 2022-11-18 DIAGNOSIS — M25511 Pain in right shoulder: Secondary | ICD-10-CM

## 2022-11-18 DIAGNOSIS — M6281 Muscle weakness (generalized): Secondary | ICD-10-CM | POA: Diagnosis not present

## 2022-11-18 DIAGNOSIS — R600 Localized edema: Secondary | ICD-10-CM

## 2022-11-18 NOTE — Therapy (Signed)
OUTPATIENT PHYSICAL THERAPY SHOULDER  Discharge  Patient Name: Barbara Roman MRN: [id number] DOB:Nov 26, 1955, 67 y.o., female Today's Date: 11/18/2022  END OF SESSION:  PT End of Session - 11/18/22 0950     Visit Number 5    Number of Visits 24    Date for PT Re-Evaluation 12/26/22    Progress Note Due on Visit 12    PT Start Time 0942    PT Stop Time 1015    PT Time Calculation (min) 33 min    Activity Tolerance Patient tolerated treatment well    Behavior During Therapy [facility] for tasks assessed/performed               Past Medical History:  Diagnosis Date   Cervical dysplasia    Dysmenorrhea    Fibroid    Hyperlipidemia    Vitamin D deficiency    Past Surgical History:  Procedure Laterality Date   COLPOSCOPY     FOOT SURGERY     TOTAL VAGINAL HYSTERECTOMY  02/17/1997   TUBAL LIGATION     Patient Active Problem List   Diagnosis Date Noted   Hyperlipidemia 07/06/2018   H/O vitamin D deficiency 12/24/2015   Family history of diabetes mellitus 11/12/2012    PCP: Ronnald Nian, MD   REFERRING PROVIDER:Lalonde, Everardo All, MD    REFERRING DIAG: M25.511 (ICD-10-CM) - Acute pain of right shoulder   THERAPY DIAG:  Acute pain of right shoulder  Muscle weakness (generalized)  Localized edema  Rationale for Evaluation and Treatment: Rehabilitation  ONSET DATE: March 2024  SUBJECTIVE:                                                                                                                                                                                      SUBJECTIVE STATEMENT: No pain today. Pt feels like she is ready to work at home on her own.   PERTINENT HISTORY: Cervical dysplasia, partial hysterectomy, bunionectomy left,   PAIN:  NPRS scale: 0/10 Pain location: Rt shoulder Pain description: soreness Aggravating factors: reaching to opposite shoulder to wash and put on deodorant Relieving factors: change positions  PRECAUTIONS:  None  WEIGHT BEARING RESTRICTIONS: No  FALLS:  Has patient fallen in last 6 months? No  LIVING ENVIRONMENT: Lives with: lives with their family and lives alone Lives in: House/apartment Stairs: No Has following equipment at home: None  OCCUPATION: Retired, [employer]  PLOF: Independent  PATIENT GOALS: Be able to perform ADL's with no pain  Next MD visit:   OBJECTIVE:   DIAGNOSTIC FINDINGS:  10/14/22: None found  PATIENT SURVEYS:  10/14/22: FOTO intake: 65% 11/11/22: FOTO update: 69% 11/18/22:  FOTO update: 79.7%  COGNITION: Overall cognitive status: WFL     SENSATION: WFL  POSTURE: Rounded shoulders and forward head, mild thoracic kyphosis  UPPER EXTREMITY ROM:   ROM Right 10/14/22 Left 10/14/22 Rt 11/11/22 Rt 11/18/22  Shoulder flexion 140 Painful arc starting at 80 deg 160 156 164  Shoulder extension      Shoulder abduction 130 Painful end range 158 160 170  Shoulder adduction      Shoulder internal rotation 80 Shoulder abd 45 deg 80 Shoulder abd 45 deg 80 Shoulder abd 45 deg 85 Shoulder abd 45 deg  Shoulder external rotation 60 Shoulder abd 45 deg 75 Shoulder abd 45 deg 70 Shoulder abd 45 deg 72 Shoulder abd 45 deg  Elbow flexion      Elbow extension      Wrist flexion      Wrist extension      Wrist ulnar deviation      Wrist radial deviation      Wrist pronation      Wrist supination      (Blank rows = not tested)  UPPER EXTREMITY MMT:  MMT Right 10/14/22 Left 10/14/22 Rt 11/18/22  Shoulder flexion 3 5 4+  Shoulder extension 4 5 5   Shoulder abduction 3 5 5   Shoulder adduction     Shoulder internal rotation 4 5 5   Shoulder external rotation 4 5 5   Middle trapezius     Lower trapezius     Elbow flexion     Elbow extension     Wrist flexion     Wrist extension     Wrist ulnar deviation     Wrist radial deviation     Wrist pronation     Wrist supination     Grip strength (lbs)     (Blank rows = not tested)   JOINT MOBILITY  TESTING:  Tightness in anterior Rt shoulder capsule  PALPATION:  TTP; anterior Rt shoulder, supraspinatus tendon, middle deltoid                                                                                                                                                                                                  TODAY'S TREATMENT:  DATE: 11/11/22:  TherEx:  UBE: level 2 x 7 minutes Reviewed all updated HEP Performed MMT and ROM see charts above   11/11/22:  TherEx:  Pulleys: 2 minutes flexion and abduction Rolling 2 #  ball up and down the wall  x 10  Rows: green TB 2 x 10 holding 3 sec Standing shoulder stability with 2# ball on wall circles both directions Shoulder IR/ER rotation: red TB 2 x 10  Prone: Ys and Ws: each x 10 bil UEs Side lying: Rt shoulder abd: x 10 c 1#  Side lying: Rt shoulder ER x 10 c 2 #  Side lying shoulder stability exercises circles both directions x 20 Rt shoulder in 90 deg abduction Door stretch: 90 deg x 2 holding 30 sec Door stretch: 30 deg x 2 holding 30 sec  ROM measurements performed see above FOTO update: 69%    11/04/22:  TherEx:  Pulleys: 2 minutes flexion and abduction UE ranger: x 10 flexion, x 10 scaption Rolling ball up and down the wall x 10 holding end range x 4-5 seconds Rows: green TB 2 x 10 holding 3 sec Shoulder Extension: red TB: 2 x 10 holding 3 sec Shoulder IR/ER rotation: red TB 2 x 10  AAROM shoulder abd c 2 # bar x 10 holding end range 2-3 seconds Prone: Ys and Ws: each x 10 bil UEs Side lying: Rt shoulder abd: x 10 c 2#  Side lying: Rt shoulder ER x 10 c 2 #    10/27/22:  TherEx:  Scifit UE only Level 1.0 2 minutes  each direction Rows: green TB 2 x 10 holding 3 sec Shoulder Extension: red TB: 2 x 10 holding 3 sec Shoulder IR/ER rotation: red TB 2 x 10  Rolling red physio-ball up and down the wall x 10  holding end range stretch x 3 sec Shoulder stability arm at 90 deg elbow slightly bent x 20 circles each direction Door stretch at 90 degrees: x 3 holding 20 sec      PATIENT EDUCATION: Education details: HEP, POC Person educated: Patient Education method: Programmer, multimedia, Facilities manager, Verbal cues, and Handouts Education comprehension: verbalized understanding, returned demonstration, and verbal cues required  HOME EXERCISE PROGRAM: Access Code: [email or web address] URL: [email or web address] Date: 11/18/2022 Prepared by: Narda Amber  Exercises - Supine Shoulder Flexion Extension AAROM with Dowel  - 2 x daily - 7 x weekly - 10 reps - 3 seconds hold - Supine Shoulder External Rotation in 45 Degrees Abduction AAROM with Dowel  - 2 x daily - 7 x weekly - 10 reps - 3 seconds hold - Standing Shoulder Row with Anchored Resistance  - 2 x daily - 7 x weekly - 2 sets - 10 reps - Wall Push Up  - 1 x daily - 7 x weekly - 1-2 sets - 10 reps - Shoulder External Rotation with Anchored Resistance  - 1 x daily - 7 x weekly - 2 sets - 10 reps - Shoulder Internal Rotation with Resistance  - 1 x daily - 7 x weekly - 2 sets - 10 reps - Standing Wall [employer] with Mini Swiss Ball  - 1 x daily - 7 x weekly - 10-15 reps - Doorway Pec Stretch at 60 Elevation  - 1 x daily - 7 x weekly - 3 reps - 20 seconds hold - Standing Shoulder Flexion to 90 Degrees with Dumbbells  - 1 x daily - 7 x weekly - 2 sets - 10 reps - Shoulder Abduction  with Dumbbells - Thumbs Up  - 1 x daily - 7 x weekly - 2 sets - 10 reps  ASSESSMENT:  CLINICAL IMPRESSION: Pt has improved her FOTO score to 80%. Pt has met 5 out of 6 of her LTG's set at her initial evaluation. Pt's HEP was updated to exercises we have been working on in the clinic. Pt reporting no pain over the last week. Pt is being discharged from skilled PT interventions at present time.     OBJECTIVE IMPAIRMENTS: decreased mobility, difficulty walking, decreased  ROM, decreased strength, impaired flexibility, impaired UE functional use, and pain.   ACTIVITY LIMITATIONS: lifting, sleeping, bathing, dressing, and hygiene/grooming  PARTICIPATION LIMITATIONS: shopping and community activity  PERSONAL FACTORS:  see pertinent history above  are also affecting patient's functional outcome.   REHAB POTENTIAL: Good  CLINICAL DECISION MAKING: Stable/uncomplicated  EVALUATION COMPLEXITY: Low   GOALS: Goals reviewed with patient? Yes  SHORT TERM GOALS: (target date for Short term goals are 3 weeks 11/07/22)  1.Patient will demonstrate independent use of home exercise program to maintain progress from in clinic treatments. Goal status: MET 11/04/22  LONG TERM GOALS: (target dates for all long term goals are 10 weeks  12/26/22 )   1. Patient will demonstrate/report pain at worst less than or equal to 2/10 to facilitate minimal limitation in daily activity secondary to pain symptoms. Goal status:MET 11/18/22   2. Patient will demonstrate independent use of home exercise program to facilitate ability to maintain/progress functional gains from skilled physical therapy services. Goal status: MET 11/18/22   3. Patient will demonstrate FOTO outcome > or = 69 % to indicate reduced disability due to condition. Goal status: MET 11/11/22   4.  Patient will demonstrate Rt UE MMT 5/5 throughout to facilitate lifting, reaching, carrying at [facility] in daily activity.   Goal status: Partial MET 11/18/22   5.  Patient will demonstrate Rt GH joint AROM WFL s symptoms to facilitate usual overhead reaching, self care, dressing at PLOF.    Goal status: MET 11/18/22   6.  Pt will be able to reach to put on deodorant  and bath under her arm with no pain in Rt UE.  Goal status: MET 11/18/22     PLAN:  PT FREQUENCY: 1-2x/week  PT DURATION: 10 weeks  PLANNED INTERVENTIONS: Therapeutic exercises, Therapeutic activity, Neuro Muscular re-education, Balance training, Gait  training, Patient/Family education, Joint mobilization, Stair training, DME instructions, Dry Needling, Electrical stimulation, Traction, Cryotherapy, vasopneumatic device Moist heat, Taping, Ultrasound, Ionotophoresis 4mg /ml Dexamethasone, and aquatic therapy ,Manual therapy.  All included unless contraindicated  PLAN FOR NEXT SESSION: Discharge       Sharmon Leyden, PT, MPT 11/18/2022, 9:52 AM PHYSICAL THERAPY DISCHARGE SUMMARY  Visits from Start of Care: 5  Current functional level related to goals / functional outcomes: See above   Remaining deficits: See above   Education / Equipment: HEP   Patient agrees to discharge. Patient goals were partially met. Patient is being discharged due to being pleased with the current functional level.   Referring diagnosis? M25.511 (ICD-10-CM) - Acute pain of right shoulder  Treatment diagnosis? (if different than referring diagnosis) M25.511, M61,81, R60.0, M25.572 What was this (referring dx) caused by? []  Surgery []  Fall [x]  Ongoing issue []  Arthritis []  Other: ____________  Laterality: []  Rt []  Lt []  Both  Check all possible CPT codes:  *CHOOSE 10 OR LESS*    []  97110 (Therapeutic Exercise)  []  92507 (SLP Treatment)  []  16109 (  Neuro Re-ed)   []  92526 (Swallowing Treatment)   []  97116 (Gait Training)   []  [phone] (Cognitive Training, 1st 15 minutes) []  97140 (Manual Therapy)   []  97130 (Cognitive Training, each add'l 15 minutes)  []  97164 (Re-evaluation)                              []  Other, List CPT Code ____________  []  97530 (Therapeutic Activities)     []  97535 (Self Care)   [x]  All codes above (97110 - 97535)  []  97012 (Mechanical Traction)  [x]  97014 (E-stim Unattended)  []  97032 (E-stim manual)  []  97033 (Ionto)  []  97035 (Ultrasound) [x]  97750 (Physical Performance Training) []  [id number] (Aquatic Therapy) []  97016 (Vasopneumatic Device) []  96295 (Paraffin) []  97034 (Contrast Bath) []  97597 (Wound Care 1st 20 sq  cm) []  97598 (Wound Care each add'l 20 sq cm) []  97760 (Orthotic Fabrication, Fitting, Training Initial) []  [id number] (Prosthetic Management and Training Initial) []  [id number] (Orthotic or Prosthetic Training/ Modification Subsequent)

## 2022-11-25 ENCOUNTER — Encounter: Payer: Medicare PPO | Admitting: Physical Therapy

## 2022-11-26 ENCOUNTER — Ambulatory Visit
Admission: RE | Admit: 2022-11-26 | Discharge: 2022-11-26 | Disposition: A | Discharge status: Home / Self Care | Payer: Medicare PPO | Source: Ambulatory Visit | Attending: Family Medicine | Admitting: Family Medicine

## 2022-11-26 DIAGNOSIS — Z1231 Encounter for screening mammogram for malignant neoplasm of breast: Secondary | ICD-10-CM

## 2022-12-01 ENCOUNTER — Other Ambulatory Visit: Payer: Self-pay | Admitting: Family Medicine

## 2022-12-01 DIAGNOSIS — R928 Other abnormal and inconclusive findings on diagnostic imaging of breast: Secondary | ICD-10-CM

## 2022-12-02 ENCOUNTER — Encounter: Payer: Medicare PPO | Admitting: Physical Therapy

## 2022-12-10 ENCOUNTER — Ambulatory Visit
Admission: RE | Admit: 2022-12-10 | Discharge: 2022-12-10 | Disposition: A | Discharge status: Home / Self Care | Payer: Medicare PPO | Source: Ambulatory Visit | Attending: Family Medicine | Admitting: Family Medicine

## 2022-12-10 ENCOUNTER — Other Ambulatory Visit: Payer: Self-pay | Admitting: Family Medicine

## 2022-12-10 DIAGNOSIS — R928 Other abnormal and inconclusive findings on diagnostic imaging of breast: Secondary | ICD-10-CM

## 2022-12-10 DIAGNOSIS — N6489 Other specified disorders of breast: Secondary | ICD-10-CM

## 2023-01-01 DIAGNOSIS — H209 Unspecified iridocyclitis: Secondary | ICD-10-CM | POA: Diagnosis not present

## 2023-01-09 DIAGNOSIS — H209 Unspecified iridocyclitis: Secondary | ICD-10-CM | POA: Diagnosis not present

## 2023-02-27 DIAGNOSIS — H35353 Cystoid macular degeneration, bilateral: Secondary | ICD-10-CM | POA: Diagnosis not present

## 2023-03-04 ENCOUNTER — Encounter: Payer: Self-pay | Admitting: Family Medicine

## 2023-03-04 ENCOUNTER — Ambulatory Visit: Payer: Medicare PPO | Admitting: Family Medicine

## 2023-03-04 VITALS — BP 120/82 | HR 60 | Temp 98.0°F | Wt 147.4 lb

## 2023-03-04 DIAGNOSIS — R42 Dizziness and giddiness: Secondary | ICD-10-CM

## 2023-03-04 MED ORDER — MECLIZINE HCL 12.5 MG PO TABS: 12.5000 mg | ORAL_TABLET | Freq: Three times a day (TID) | ORAL | 0 refills | Status: DC | PRN

## 2023-03-04 NOTE — Progress Notes (Signed)
   Subjective:    Patient ID: Barbara Roman, female    DOB: Jul 21, 1955, 68 y.o.   MRN: [id number]  HPI She states that she woke up this morning and felt like the room was spinning.  No blurred vision, double vision, numbness, tingling, fever, chills, nasal congestion or rhinorrhea.  Head position makes no difference.   Review of Systems     Objective:    Physical Exam Alert and in no distress.  EOMI.  Other cranial nerves grossly intact.  Normal cerebellar.  Normal finger-to-nose.  DTRs normal.  Tympanic membranes and canals are normal. Pharyngeal area is normal. Neck is supple without adenopathy or thyromegaly. Cardiac exam shows a regular sinus rhythm without murmurs or gallops. Lungs are clear to auscultation.        Assessment & Plan:  Dizziness - Plan: meclizine  (ANTIVERT ) 12.5 MG tablet I explained that at this time there is no evidence of anything of major concern.  I will treat her and she is to keep track of any symptoms that she might have and if continued difficulty call me back.  She was comfortable with that.

## 2023-04-14 ENCOUNTER — Encounter: Payer: Self-pay | Admitting: Internal Medicine

## 2023-06-01 DIAGNOSIS — R102 Pelvic and perineal pain: Secondary | ICD-10-CM | POA: Diagnosis not present

## 2023-06-10 ENCOUNTER — Ambulatory Visit
Admission: RE | Admit: 2023-06-10 | Discharge: 2023-06-10 | Disposition: A | Discharge status: Home / Self Care | Payer: Medicare PPO | Source: Ambulatory Visit | Attending: Family Medicine | Admitting: Family Medicine

## 2023-06-10 ENCOUNTER — Ambulatory Visit: Admission: RE | Admit: 2023-06-10 | Payer: Medicare PPO | Source: Ambulatory Visit

## 2023-06-10 DIAGNOSIS — N6489 Other specified disorders of breast: Secondary | ICD-10-CM

## 2023-06-10 DIAGNOSIS — R928 Other abnormal and inconclusive findings on diagnostic imaging of breast: Secondary | ICD-10-CM

## 2023-06-16 DIAGNOSIS — R102 Pelvic and perineal pain: Secondary | ICD-10-CM | POA: Diagnosis not present

## 2023-07-11 ENCOUNTER — Other Ambulatory Visit: Payer: Self-pay | Admitting: Family Medicine

## 2023-07-11 DIAGNOSIS — E785 Hyperlipidemia, unspecified: Secondary | ICD-10-CM

## 2023-07-28 ENCOUNTER — Ambulatory Visit: Payer: Medicare PPO

## 2023-07-28 DIAGNOSIS — Z0000 Encounter for general adult medical examination without abnormal findings: Secondary | ICD-10-CM | POA: Diagnosis not present

## 2023-07-28 NOTE — Patient Instructions (Signed)
 Barbara Roman , Thank you for taking time out of your busy schedule to complete your Annual Wellness Visit with me. I enjoyed our conversation and look forward to speaking with you again next year. I, as well as your care team,  appreciate your ongoing commitment to your health goals. Please review the following plan we discussed and let me know if I can assist you in the future. Your Game plan/ To Do List    Referrals: If you haven't heard from the office you've been referred to, please reach out to them at the phone provided.  N/a Follow up Visits: Next Medicare AWV with our clinical staff: 08/03/2023 at 10:50   Have you seen your provider in the last 6 months (3 months if uncontrolled diabetes)? Yes Next Office Visit with your provider: 08/19/2023 at 10:15  Clinician Recommendations:  Aim for 30 minutes of exercise or brisk walking, 6-8 glasses of water, and 5 servings of fruits and vegetables each day.       This is a list of the screening recommended for you and due dates:  Health Maintenance  Topic Date Due   COVID-19 Vaccine (5 - 2024-25 season) 10/19/2022   Flu Shot  09/18/2023   Medicare Annual Wellness Visit  07/27/2024   Cologuard (Stool DNA test)  08/12/2024   DTaP/Tdap/Td vaccine (2 - Td or Tdap) 10/04/2024   Mammogram  11/25/2024   Pneumonia Vaccine  Completed   DEXA scan (bone density measurement)  Completed   Hepatitis C Screening  Completed   Zoster (Shingles) Vaccine  Completed   HPV Vaccine  Aged Out   Meningitis B Vaccine  Aged Out   Colon Cancer Screening  Discontinued    Advanced directives: (Copy Requested) Please bring a copy of your health care power of attorney and living will to the office to be added to your chart at your convenience. You can mail to [facility] [address] [place], [place] [address] or email to [email or web address] Advance Care Planning is important because it:  [x]  Makes sure you receive the medical care that is  consistent with your values, goals, and preferences  [x]  It provides guidance to your family and loved ones and reduces their decisional burden about whether or not they are making the right decisions based on your wishes.  Follow the link provided in your after visit summary or read over the paperwork we have mailed to you to help you started getting your Advance Directives in place. If you need assistance in completing these, please reach out to us  so that we can help you!  See attachments for Preventive Care and Fall Prevention Tips.

## 2023-07-28 NOTE — Progress Notes (Signed)
 Subjective:   Barbara Roman is a 68 y.o. who presents for a Medicare Wellness preventive visit.  As a reminder, Annual Wellness Visits don't include a physical exam, and some assessments may be limited, especially if this visit is performed virtually. We may recommend an in-person follow-up visit with your provider if needed.  Visit Complete: Virtual I connected with  Barbara Roman on 07/28/23 by a audio enabled telemedicine application and verified that I am speaking with the correct person using two identifiers.  Patient Location: Home  Provider Location: Office/Clinic  I discussed the limitations of evaluation and management by telemedicine. The patient expressed understanding and agreed to proceed.  Vital Signs: Because this visit was a virtual/telehealth visit, some criteria may be missing or patient reported. Any vitals not documented were not able to be obtained and vitals that have been documented are patient reported.  VideoError- Librarian, academic were attempted between this provider and patient, however failed, due to patient having technical difficulties OR patient did not have access to video capability.  We continued and completed visit with audio only.   Persons Participating in Visit: Patient.  AWV Questionnaire: No: Patient Medicare AWV questionnaire was not completed prior to this visit.  Cardiac Risk Factors include: advanced age (>28men, >59 women);dyslipidemia     Objective:     Today's Vitals   There is no height or weight on file to calculate BMI.     07/28/2023   10:51 AM 10/14/2022    9:31 AM 07/22/2022   10:15 AM 03/21/2022   11:15 AM 07/19/2021    9:30 AM  Advanced Directives  Does Patient Have a Medical Advance Directive? Yes No Yes Yes Yes  Type of Estate agent of [place];Living will  Healthcare Power of [place];Living will Healthcare Power of Attorney Living will  Does patient  want to make changes to medical advance directive?    No - Patient declined No - Patient declined  Copy of Healthcare Power of Attorney in Chart? No - copy requested  No - copy requested    Would patient like information on creating a medical advance directive?  No - Patient declined       Current Medications (verified) Outpatient Encounter Medications as of 07/28/2023  Medication Sig   rosuvastatin  (CRESTOR ) 40 MG tablet TAKE 1 TABLET(40 MG) BY MOUTH DAILY   acyclovir (ZOVIRAX) 800 MG tablet Take 4,000 mg by mouth daily. (Patient not taking: Reported on 07/28/2023)   Ascorbic Acid (VITAMIN C PO) Take by mouth. (Patient not taking: Reported on 07/28/2023)   BLACK ELDERBERRY PO Take by mouth. (Patient not taking: Reported on 07/28/2023)   desonide (DESOWEN) 0.05 % cream  (Patient not taking: Reported on 10/07/2022)   Iron-Vitamins (GERITOL PO) Take by mouth. (Patient not taking: Reported on 10/07/2022)   meclizine  (ANTIVERT ) 12.5 MG tablet Take 1 tablet (12.5 mg total) by mouth 3 (three) times daily as needed for dizziness. (Patient not taking: Reported on 07/28/2023)   triamcinolone  cream (KENALOG ) 0.1 % Apply 1 application topically 2 (two) times daily. (Patient not taking: Reported on 10/07/2022)   VITAMIN D  PO Take by mouth.   No facility-administered encounter medications on file as of 07/28/2023.    Allergies (verified) Patient has no known allergies.   History: Past Medical History:  Diagnosis Date   Cervical dysplasia    Dysmenorrhea    Fibroid    Hyperlipidemia    Vitamin D  deficiency    Past Surgical  History:  Procedure Laterality Date   CATARACT EXTRACTION Bilateral    June and July 2024   COLPOSCOPY     FOOT SURGERY     TOTAL VAGINAL HYSTERECTOMY  02/17/1997   TUBAL LIGATION     Family History  Problem Relation Age of Onset   Diabetes Father    Hypertension Father    Heart disease Father    Stroke Father    Hypertension Mother    Stroke Mother    Stroke Sister     Breast cancer Maternal Aunt        Age 30's   Breast cancer Paternal Grandmother        Age 79's   Stroke Brother    Social History   Socioeconomic History   Marital status: Widowed    Spouse name: Not on file   Number of children: Not on file   Years of education: Not on file   Highest education level: Not on file  Occupational History   Occupation: retired  Tobacco Use   Smoking status: Never   Smokeless tobacco: Never  Vaping Use   Vaping status: Never Used  Substance and Sexual Activity   Alcohol use: Not Currently    Comment: RARE   Drug use: No   Sexual activity: Yes    Birth control/protection: Surgical    Comment: 1ST INTERCOURSE- 19, PARTNERS- 3  Other Topics Concern   Not on file  Social History Narrative   Not on file   Social Drivers of Health   Financial Resource Strain: Low Risk  (07/28/2023)   Overall Financial Resource Strain (CARDIA)    Difficulty of Paying Living Expenses: Not hard at all  Food Insecurity: No Food Insecurity (07/28/2023)   Hunger Vital Sign    Worried About Running Out of Food in the Last Year: Never true    Ran Out of Food in the Last Year: Never true  Transportation Needs: No Transportation Needs (07/28/2023)   PRAPARE - Administrator, Civil Service (Medical): No    Lack of Transportation (Non-Medical): No  Physical Activity: Inactive (07/28/2023)   Exercise Vital Sign    Days of Exercise per Week: 0 days    Minutes of Exercise per Session: 0 min  Stress: No Stress Concern Present (07/28/2023)   [employer] of Occupational Health - Occupational Stress Questionnaire    Feeling of Stress : Not at all  Social Connections: Moderately Integrated (07/28/2023)   Social Connection and Isolation Panel [NHANES]    Frequency of Communication with Friends and Family: More than three times a week    Frequency of Social Gatherings with Friends and Family: Twice a week    Attends Religious Services: More than 4 times per year     Active Member of [employer] or Organizations: Yes    Attends Banker Meetings: More than 4 times per year    Marital Status: Widowed  Recent Concern: Social Connections - Moderately Isolated (07/28/2023)   Social Connection and Isolation Panel [NHANES]    Frequency of Communication with Friends and Family: More than three times a week    Frequency of Social Gatherings with Friends and Family: Twice a week    Attends Religious Services: More than 4 times per year    Active Member of [employer] or Organizations: No    Attends Banker Meetings: Never    Marital Status: Widowed    Tobacco Counseling Counseling given: Not Answered    Clinical  Intake:  Pre-visit preparation completed: Yes  Pain : No/denies pain     Nutritional Risks: None Diabetes: No  Lab Results  Component Value Date   HGBA1C 6.0 (H) 11/12/2012     How often do you need to have someone help you when you read instructions, pamphlets, or other written materials from your doctor or pharmacy?: 1 - Never  Interpreter Needed?: No  Information entered by :: NAllen LPN   Activities of Daily Living     07/28/2023   10:46 AM  In your present state of health, do you have any difficulty performing the following activities:  Hearing? 0  Vision? 0  Difficulty concentrating or making decisions? 0  Walking or climbing stairs? 0  Dressing or bathing? 0  Doing errands, shopping? 0  Preparing Food and eating ? N  Using the Toilet? N  In the past six months, have you accidently leaked urine? N  Do you have problems with loss of bowel control? N  Managing your Medications? N  Managing your Finances? N  Housekeeping or managing your Housekeeping? N    Patient Care Team: Watson Hacking, MD as PCP - General (Family Medicine)  I have updated your Care Teams any recent Medical Services you may have received from other providers in the past year.     Assessment:    This is a routine wellness  examination for Bandon.  Hearing/Vision screen Hearing Screening - Comments:: Denies hearing issues Vision Screening - Comments:: Regular eye exams, [facility]   Goals Addressed             This Visit's Progress    Patient Stated       07/28/2023, wants to lose stomach       Depression Screen     07/28/2023   10:53 AM 07/22/2022   10:16 AM 07/19/2021    9:31 AM 07/10/2020   10:46 AM 06/06/2020    3:33 PM 01/26/2020    3:29 PM 09/28/2019   11:27 AM  PHQ 2/9 Scores  PHQ - 2 Score 0 0 0 0 0 0 0  PHQ- 9 Score 0 0  0       Fall Risk     07/28/2023   10:52 AM 07/22/2022   10:16 AM 03/05/2022    2:26 PM 07/19/2021    9:30 AM 07/10/2020   10:45 AM  Fall Risk   Falls in the past year? 0 0 0 0 1  Number falls in past yr: 0 0 0 0 0  Injury with Fall? 0 0 0 0 1  Comment     right knee  Risk for fall due to : Medication side effect No Fall Risks No Fall Risks No Fall Risks Other (Comment)  Follow up Falls evaluation completed;Falls prevention discussed Falls prevention discussed;Education provided;Falls evaluation completed Falls evaluation completed Falls evaluation completed Falls evaluation completed    MEDICARE RISK AT HOME:  Medicare Risk at Home Any stairs in or around the home?: No If so, are there any without handrails?: No Home free of loose throw rugs in walkways, pet beds, electrical cords, etc?: Yes Adequate lighting in your home to reduce risk of falls?: Yes Life alert?: No Use of a cane, walker or w/c?: No Grab bars in the bathroom?: Yes Shower chair or bench in shower?: No Elevated toilet seat or a handicapped toilet?: Yes  TIMED UP AND GO:  Was the test performed?  No  Cognitive Function: 6CIT completed  07/28/2023   10:53 AM 07/22/2022   10:18 AM 07/19/2021    9:32 AM  6CIT Screen  What Year? 0 points 0 points 0 points  What month? 0 points 0 points 0 points  What time? 0 points 0 points 0 points  Count back from 20 0 points 0 points 0 points   Months in reverse 0 points 0 points 0 points  Repeat phrase 0 points 2 points 0 points  Total Score 0 points 2 points 0 points    Immunizations Immunization History  Administered Date(s) Administered   Fluad Quad(high Dose 65+) 12/09/2021   Influenza,inj,Quad PF,6+ Mos 11/16/2017, 11/23/2018   Influenza-Unspecified 11/21/2016, 12/18/2020   PFIZER(Purple Top)SARS-COV-2 Vaccination 05/24/2019, 06/16/2019, 01/26/2020   PNEUMOCOCCAL CONJUGATE-20 07/19/2021   Pfizer Covid-19 Vaccine Bivalent Booster 91yrs & up 03/19/2021   Tdap 10/05/2014   Zoster Recombinant(Shingrix) 11/21/2016, 01/23/2017    Screening Tests Health Maintenance  Topic Date Due   COVID-19 Vaccine (5 - 2024-25 season) 10/19/2022   INFLUENZA VACCINE  09/18/2023   Medicare Annual Wellness (AWV)  07/27/2024   Fecal DNA (Cologuard)  08/12/2024   DTaP/Tdap/Td (2 - Td or Tdap) 10/04/2024   MAMMOGRAM  11/25/2024   Pneumonia Vaccine 75+ Years old  Completed   DEXA SCAN  Completed   Hepatitis C Screening  Completed   Zoster Vaccines- Shingrix  Completed   HPV VACCINES  Aged Out   Meningococcal B Vaccine  Aged Out   Colonoscopy  Discontinued    Health Maintenance  Health Maintenance Due  Topic Date Due   COVID-19 Vaccine (5 - 2024-25 season) 10/19/2022   Health Maintenance Items Addressed: Declines covid vaccine.  Additional Screening:  Vision Screening: Recommended annual ophthalmology exams for early detection of glaucoma and other disorders of the eye. Would you like a referral to an eye doctor? No    Dental Screening: Recommended annual dental exams for proper oral hygiene  Community Resource Referral / Chronic Care Management: CRR required this visit?  No   CCM required this visit?  No   Plan:    I have personally reviewed and noted the following in the patient's chart:   Medical and social history Use of alcohol, tobacco or illicit drugs  Current medications and supplements including opioid  prescriptions. Patient is not currently taking opioid prescriptions. Functional ability and status Nutritional status Physical activity Advanced directives List of other physicians Hospitalizations, surgeries, and ER visits in previous 12 months Vitals Screenings to include cognitive, depression, and falls Referrals and appointments  In addition, I have reviewed and discussed with patient certain preventive protocols, quality metrics, and best practice recommendations. A written personalized care plan for preventive services as well as general preventive health recommendations were provided to patient.   Areatha Beecham, LPN   9/81/1914   After Visit Summary: (MyChart) Due to this being a telephonic visit, the after visit summary with patients personalized plan was offered to patient via MyChart   Notes: Nothing significant to report at this time.

## 2023-08-03 ENCOUNTER — Telehealth: Payer: Self-pay

## 2023-08-03 ENCOUNTER — Telehealth: Payer: Self-pay | Admitting: Internal Medicine

## 2023-08-03 NOTE — Telephone Encounter (Signed)
 Copied from CRM [id number]. Topic: General - Other >> Aug 03, 2023  4:29 PM Marissa P wrote: Reason for CRM: Patient stated that she was able to go into mychart and see it for herself the date of the last shot so please disregard request, thank you  Patient was able to see the date of her last shot.

## 2023-08-03 NOTE — Telephone Encounter (Signed)
 Copied from CRM [id number]. Topic: General - Other >> Aug 03, 2023  4:29 PM Marissa P wrote: Reason for CRM: Patient stated that she was able to go into mychart and see it for herself the date of the last shot so please disregard request, thank you

## 2023-08-03 NOTE — Telephone Encounter (Signed)
 Patient had Pneumonia 20 on 07/19/2021, pt was 66 at the time. Is she due for another one?  Copied from CRM [id number]. Topic: General - Other >> Aug 03, 2023  3:18 PM Barbara Roman T wrote: Reason for CRM: patient request a call back as she was not sure if she had a pneumonia shot or not

## 2023-08-07 DIAGNOSIS — L578 Other skin changes due to chronic exposure to nonionizing radiation: Secondary | ICD-10-CM | POA: Diagnosis not present

## 2023-08-07 DIAGNOSIS — L438 Other lichen planus: Secondary | ICD-10-CM | POA: Diagnosis not present

## 2023-08-07 DIAGNOSIS — K120 Recurrent oral aphthae: Secondary | ICD-10-CM | POA: Diagnosis not present

## 2023-08-16 DIAGNOSIS — Z0000 Encounter for general adult medical examination without abnormal findings: Secondary | ICD-10-CM | POA: Insufficient documentation

## 2023-08-16 DIAGNOSIS — R7303 Prediabetes: Secondary | ICD-10-CM | POA: Insufficient documentation

## 2023-08-19 ENCOUNTER — Ambulatory Visit: Payer: Medicare PPO | Admitting: Family Medicine

## 2023-08-19 ENCOUNTER — Encounter: Payer: Self-pay | Admitting: Family Medicine

## 2023-08-19 VITALS — BP 124/80 | HR 61 | Ht 64.0 in | Wt 150.2 lb

## 2023-08-19 DIAGNOSIS — L439 Lichen planus, unspecified: Secondary | ICD-10-CM | POA: Insufficient documentation

## 2023-08-19 DIAGNOSIS — Z0000 Encounter for general adult medical examination without abnormal findings: Secondary | ICD-10-CM

## 2023-08-19 DIAGNOSIS — Z9849 Cataract extraction status, unspecified eye: Secondary | ICD-10-CM | POA: Diagnosis not present

## 2023-08-19 DIAGNOSIS — R7303 Prediabetes: Secondary | ICD-10-CM | POA: Diagnosis not present

## 2023-08-19 DIAGNOSIS — E782 Mixed hyperlipidemia: Secondary | ICD-10-CM

## 2023-08-19 DIAGNOSIS — Z8639 Personal history of other endocrine, nutritional and metabolic disease: Secondary | ICD-10-CM

## 2023-08-19 LAB — CBC WITH DIFFERENTIAL/PLATELET
Basophils Absolute: 0 10*3/uL (ref 0.0–0.2)
Basos: 1 %
EOS (ABSOLUTE): 0.1 10*3/uL (ref 0.0–0.4)
Eos: 2 %
Hematocrit: 45.1 % (ref 34.0–46.6)
Hemoglobin: 14.2 g/dL (ref 11.1–15.9)
Immature Grans (Abs): 0 10*3/uL (ref 0.0–0.1)
Immature Granulocytes: 0 %
Lymphocytes Absolute: 1.8 10*3/uL (ref 0.7–3.1)
Lymphs: 36 %
MCH: 24.5 pg — ABNORMAL LOW (ref 26.6–33.0)
MCHC: 31.5 g/dL (ref 31.5–35.7)
MCV: 78 fL — ABNORMAL LOW (ref 79–97)
Monocytes Absolute: 0.5 10*3/uL (ref 0.1–0.9)
Monocytes: 11 %
Neutrophils Absolute: 2.6 10*3/uL (ref 1.4–7.0)
Neutrophils: 50 %
Platelets: 254 10*3/uL (ref 150–450)
RBC: 5.8 x10E6/uL — ABNORMAL HIGH (ref 3.77–5.28)
RDW: 14.5 % (ref 11.7–15.4)
WBC: 5 10*3/uL (ref 3.4–10.8)

## 2023-08-19 LAB — COMPREHENSIVE METABOLIC PANEL WITH GFR
ALT: 24 IU/L (ref 0–32)
AST: 26 IU/L (ref 0–40)
Albumin: 4.7 g/dL (ref 3.9–4.9)
Alkaline Phosphatase: 115 IU/L (ref 44–121)
BUN/Creatinine Ratio: 15 (ref 12–28)
BUN: 14 mg/dL (ref 8–27)
Bilirubin Total: 0.4 mg/dL (ref 0.0–1.2)
CO2: 20 mmol/L (ref 20–29)
Calcium: 10.1 mg/dL (ref 8.7–10.3)
Chloride: 102 mmol/L (ref 96–106)
Creatinine, Ser: 0.92 mg/dL (ref 0.57–1.00)
Globulin, Total: 3 g/dL (ref 1.5–4.5)
Glucose: 89 mg/dL (ref 70–99)
Potassium: 4.4 mmol/L (ref 3.5–5.2)
Sodium: 139 mmol/L (ref 134–144)
Total Protein: 7.7 g/dL (ref 6.0–8.5)
eGFR: 68 mL/min/{1.73_m2} (ref >59)

## 2023-08-19 LAB — POCT GLYCOSYLATED HEMOGLOBIN (HGB A1C): Hemoglobin A1C: 6.4 % — AB (ref 4.0–5.6)

## 2023-08-19 LAB — LIPID PANEL
Chol/HDL Ratio: 2.4 ratio (ref 0.0–4.4)
Cholesterol, Total: 136 mg/dL (ref 100–199)
HDL: 57 mg/dL (ref >39)
LDL Chol Calc (NIH): 68 mg/dL (ref 0–99)
Triglycerides: 51 mg/dL (ref 0–149)
VLDL Cholesterol Cal: 11 mg/dL (ref 5–40)

## 2023-08-19 NOTE — Progress Notes (Signed)
 Complete physical exam  Patient: Barbara Roman   DOB: Jan 16, 1956   68 y.o. Female  MRN: [id number]  Subjective:    Chief Complaint  Patient presents with   Annual Exam    Cpe. Fasting. Has some spots on face she wants checked out. Went to dermatology and they said it was nothing. Also wants mouth checked. Would also like some type of diet recommendations for high cholesterol.     Barbara Roman is a 68 y.o. female who presents today for a complete physical exam.  She reports consuming a general diet. The patient does not participate in regular exercise at present. She generally feels well. She reports sleeping well.  She helps take care of her mother and a sister that is disabled.  She is having some difficulty dealing especially with her mother but seems to be handling the situation fairly well.  Her mother is 22 and as can be expected does not want to consider moving from the house.  She has seen dermatology in the past for help with some hypopigmented areas on her face.  She also apparently has lichen planus and has been seen by dermatology for care of this although its mainly in her mouth.  She continues on Crestor  and is having no difficulty with that.  She is enjoying her retirement.  He does have a history of cataract and prediabetes.  She also has a history of vitamin D  deficiency.  Most recent fall risk assessment:    08/19/2023   10:14 AM  Fall Risk   Falls in the past year? 0  Number falls in past yr: 0  Injury with Fall? 0  Risk for fall due to : No Fall Risks  Follow up Falls evaluation completed     Most recent depression screenings:    08/19/2023   10:14 AM 07/28/2023   10:53 AM  PHQ 2/9 Scores  PHQ - 2 Score 0 0  PHQ- 9 Score  0    Vision:Within last year and Dental: No current dental problems and Last dental visit: receives regular dental care.     Immunization History  Administered Date(s) Administered   Fluad Quad(high Dose 65+) 12/09/2021    Influenza,inj,Quad PF,6+ Mos 11/16/2017, 11/23/2018   Influenza-Unspecified 11/21/2016, 12/18/2020   PFIZER(Purple Top)SARS-COV-2 Vaccination 05/24/2019, 06/16/2019, 01/26/2020   PNEUMOCOCCAL CONJUGATE-20 07/19/2021   Pfizer Covid-19 Vaccine Bivalent Booster 18yrs & up 03/19/2021   Tdap 10/05/2014   Zoster Recombinant(Shingrix) 11/21/2016, 01/23/2017    Health Maintenance  Topic Date Due   COVID-19 Vaccine (5 - 2024-25 season) 10/19/2022   INFLUENZA VACCINE  09/18/2023   Medicare Annual Wellness (AWV)  07/27/2024   Fecal DNA (Cologuard)  08/12/2024   DTaP/Tdap/Td (2 - Td or Tdap) 10/04/2024   MAMMOGRAM  11/25/2024   Pneumococcal Vaccine: 50+ Years  Completed   DEXA SCAN  Completed   Hepatitis C Screening  Completed   Zoster Vaccines- Shingrix  Completed   Hepatitis B Vaccines  Aged Out   HPV VACCINES  Aged Out   Meningococcal B Vaccine  Aged Out   Colonoscopy  Discontinued    Patient Care Team: Joyce Norleen BROCKS, MD as PCP - General (Family Medicine)   Outpatient Medications Prior to Visit  Medication Sig   BLACK ELDERBERRY PO Take by mouth.   rosuvastatin  (CRESTOR ) 40 MG tablet TAKE 1 TABLET(40 MG) BY MOUTH DAILY   VITAMIN D  PO Take by mouth.   acyclovir (ZOVIRAX) 800 MG tablet Take 4,000 mg  by mouth daily. (Patient not taking: Reported on 07/28/2023)   Ascorbic Acid (VITAMIN C PO) Take by mouth. (Patient not taking: Reported on 08/19/2023)   desonide (DESOWEN) 0.05 % cream  (Patient not taking: Reported on 10/07/2022)   Iron-Vitamins (GERITOL PO) Take by mouth. (Patient not taking: Reported on 08/19/2023)   meclizine  (ANTIVERT ) 12.5 MG tablet Take 1 tablet (12.5 mg total) by mouth 3 (three) times daily as needed for dizziness. (Patient not taking: Reported on 07/28/2023)   triamcinolone  cream (KENALOG ) 0.1 % Apply 1 application topically 2 (two) times daily. (Patient not taking: Reported on 10/07/2022)   No facility-administered medications prior to visit.    Review of Systems   All other systems reviewed and are negative.   Family and social history as well as health maintenance and immunizations was reviewed.     Objective:    BP 124/80   Pulse 61   Ht 5' 4 (1.626 m)   Wt 150 lb 3.2 oz (68.1 kg)   LMP 07/29/1997   SpO2 99%   BMI 25.78 kg/m    Physical Exam  Alert and in no distress. Tympanic membranes and canals are normal. Pharyngeal area is normal. Neck is supple without adenopathy or thyromegaly. Cardiac exam shows a regular sinus rhythm without murmurs or gallops. Lungs are clear to auscultation. Hemoglobin A1c is 6.4     Assessment & Plan:     Routine general medical examination at a health care facility  Mixed hyperlipidemia - Plan: CBC with Differential/Platelet, Comprehensive metabolic panel with GFR, Lipid panel  H/O vitamin D  deficiency  Prediabetes - Plan: CBC with Differential/Platelet, Comprehensive metabolic panel with GFR, Lipid panel, POCT glycosylated hemoglobin (Hb A1C)  History of cataract surgery, unspecified laterality  Lichen planus  I explained the diagnosis of prediabetes versus diabetes with her and again discussed need for diet and exercise changes.  Discussed cutting back on carbohydrates.  We can recheck this again in another year.  Reinforced how she is handling dealing with the stress of dealing with her mother and disabled sister. Return in about 1 year (around 08/18/2024).      [name], MD

## 2023-08-20 ENCOUNTER — Ambulatory Visit: Payer: Self-pay | Admitting: Family Medicine

## 2023-09-16 DIAGNOSIS — H209 Unspecified iridocyclitis: Secondary | ICD-10-CM | POA: Diagnosis not present

## 2023-09-30 DIAGNOSIS — H209 Unspecified iridocyclitis: Secondary | ICD-10-CM | POA: Diagnosis not present

## 2023-10-14 ENCOUNTER — Other Ambulatory Visit: Payer: Self-pay | Admitting: Family Medicine

## 2023-10-14 DIAGNOSIS — R928 Other abnormal and inconclusive findings on diagnostic imaging of breast: Secondary | ICD-10-CM

## 2023-10-14 DIAGNOSIS — N6489 Other specified disorders of breast: Secondary | ICD-10-CM

## 2023-11-10 DIAGNOSIS — Z1272 Encounter for screening for malignant neoplasm of vagina: Secondary | ICD-10-CM | POA: Diagnosis not present

## 2023-11-10 DIAGNOSIS — Z01419 Encounter for gynecological examination (general) (routine) without abnormal findings: Secondary | ICD-10-CM | POA: Diagnosis not present

## 2023-11-11 DIAGNOSIS — H209 Unspecified iridocyclitis: Secondary | ICD-10-CM | POA: Diagnosis not present

## 2023-12-11 ENCOUNTER — Ambulatory Visit
Admission: RE | Admit: 2023-12-11 | Discharge: 2023-12-11 | Disposition: A | Discharge status: Home / Self Care | Source: Ambulatory Visit | Attending: Family Medicine | Admitting: Family Medicine

## 2023-12-11 DIAGNOSIS — R928 Other abnormal and inconclusive findings on diagnostic imaging of breast: Secondary | ICD-10-CM

## 2023-12-11 DIAGNOSIS — N6489 Other specified disorders of breast: Secondary | ICD-10-CM

## 2023-12-11 DIAGNOSIS — R921 Mammographic calcification found on diagnostic imaging of breast: Secondary | ICD-10-CM | POA: Diagnosis not present

## 2023-12-23 DIAGNOSIS — H209 Unspecified iridocyclitis: Secondary | ICD-10-CM | POA: Diagnosis not present

## 2024-01-08 DIAGNOSIS — H35351 Cystoid macular degeneration, right eye: Secondary | ICD-10-CM | POA: Diagnosis not present

## 2024-01-08 DIAGNOSIS — H209 Unspecified iridocyclitis: Secondary | ICD-10-CM | POA: Diagnosis not present

## 2024-02-13 ENCOUNTER — Ambulatory Visit: Admission: EM | Admit: 2024-02-13 | Discharge: 2024-02-13 | Disposition: A | Discharge status: Home / Self Care

## 2024-02-13 ENCOUNTER — Encounter: Payer: Self-pay | Admitting: Emergency Medicine

## 2024-02-13 DIAGNOSIS — J069 Acute upper respiratory infection, unspecified: Secondary | ICD-10-CM

## 2024-02-13 LAB — POCT INFLUENZA A/B
Influenza A, POC: NEGATIVE
Influenza B, POC: NEGATIVE

## 2024-02-13 NOTE — ED Triage Notes (Signed)
 Pt presents c/o URI x 3 days. Pt states,  I got a cough and a runny nose. When I cough my chest does hurt. A couple days ago my nose was running non stop. I think I have a chest cold.  Pt denies emesis and diarrhea.

## 2024-02-13 NOTE — ED Provider Notes (Signed)
 " [facility]    CSN: [id number] Arrival date & time: 02/13/24  9193      History   Chief Complaint Chief Complaint  Patient presents with   URI    HPI Barbara Roman is a 68 y.o. female.   Pt presents today due to cough productive of clear sputum and nasal congestion for 3 days. Pt states that she started taking Mucinex yesterday with some relief. Pt states that she has chest pain when coughing but is she holds her hand over her chest while coughing that helps. Pt denies fever, chills, nausea, or vomiting.   The history is provided by the patient.  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   Routine general medical examination at a health care facility 08/16/2023   Prediabetes 08/16/2023   Hyperlipidemia 07/06/2018   H/O vitamin D  deficiency 12/24/2015   Family history of diabetes mellitus 11/12/2012    Past Surgical History:  Procedure Laterality Date   CATARACT EXTRACTION Bilateral    June and July 2024   COLPOSCOPY     FOOT SURGERY     TOTAL VAGINAL HYSTERECTOMY  02/17/1997   TUBAL LIGATION      OB History     Gravida  1   Para  1   Term  1   Preterm      AB      Living  1      SAB      IAB      Ectopic      Multiple      Live Births               Home Medications    Prior to Admission medications  Medication Sig Start Date End Date Taking? Authorizing Provider  FLUZONE HIGH-DOSE 0.5 ML injection  11/19/23  Yes [provider]  prednisoLONE acetate (PRED FORTE) 1 % ophthalmic suspension Place 1 drop into the right eye 4 (four) times daily. 12/23/23  Yes [provider]  acyclovir (ZOVIRAX) 800 MG tablet Take 4,000 mg by mouth daily. Patient not taking: Reported on 07/28/2023 02/06/21   [provider]   Ascorbic Acid (VITAMIN C PO) Take by mouth. Patient not taking: Reported on 08/19/2023    [provider]  BLACK ELDERBERRY PO Take by mouth.    [provider]  desonide (DESOWEN) 0.05 % cream  04/21/19   [provider]  Iron-Vitamins (GERITOL PO) Take by mouth. Patient not taking: Reported on 08/19/2023    [provider]  meclizine  (ANTIVERT ) 12.5 MG tablet Take 1 tablet (12.5 mg total) by mouth 3 (three) times daily as needed for dizziness. Patient not taking: Reported on 07/28/2023 03/04/23   Joyce Norleen BROCKS, MD  rosuvastatin  (CRESTOR ) 40 MG tablet TAKE 1 TABLET(40 MG) BY MOUTH DAILY 07/14/23   Lalonde, John C, MD  triamcinolone  cream (KENALOG ) 0.1 % Apply 1 application topically 2 (two) times daily. Patient not taking: Reported on 10/07/2022 08/25/18   Lendia Nordmann L, NP-C  VITAMIN D  PO Take by mouth.    [provider]    Family History Family History  Problem Relation Age of Onset   Diabetes Father    Hypertension Father    Heart disease  Father    Stroke Father    Hypertension Mother    Stroke Mother    Stroke Sister    Breast cancer Maternal Aunt        Age 34's   Breast cancer Paternal Grandmother        Age 68's   Stroke Brother     Social History Social History[1]   Allergies   Patient has no known allergies.   Review of Systems Review of Systems   Physical Exam Triage Vital Signs ED Triage Vitals  Encounter Vitals Group     BP 02/13/24 0854 (!) 159/104     Girls Systolic BP Percentile --      Girls Diastolic BP Percentile --      Boys Systolic BP Percentile --      Boys Diastolic BP Percentile --      Pulse Rate 02/13/24 0854 80     Resp 02/13/24 0854 18     Temp 02/13/24 0854 98.7 F (37.1 C)     Temp Source 02/13/24 0854 Oral     SpO2 02/13/24 0854 97 %     Weight 02/13/24 0853 150 lb 2.1 oz (68.1 kg)     Height --      Head Circumference --      Peak Flow --      Pain Score 02/13/24 0852 4     Pain  Loc --      Pain Education --      Exclude from Growth Chart --    No data found.  Updated Vital Signs BP (!) 159/104 (BP Location: Left Arm)   Pulse 80   Temp 98.7 F (37.1 C) (Oral)   Resp 18   Wt 150 lb 2.1 oz (68.1 kg)   LMP 07/29/1997   SpO2 97%   BMI 25.77 kg/m   Visual Acuity Right Eye Distance:   Left Eye Distance:   Bilateral Distance:    Right Eye Near:   Left Eye Near:    Bilateral Near:     Physical Exam Vitals and nursing note reviewed.  Constitutional:      General: She is not in acute distress.    Appearance: Normal appearance. She is not ill-appearing, toxic-appearing or diaphoretic.  HENT:     Nose: Congestion (moderately enlarged turbinates) present. No rhinorrhea.     Mouth/Throat:     Mouth: Mucous membranes are moist.     Pharynx: Oropharynx is clear. No oropharyngeal exudate or posterior oropharyngeal erythema.  Eyes:     General: No scleral icterus. Cardiovascular:     Rate and Rhythm: Normal rate and regular rhythm.     Heart sounds: Normal heart sounds.  Pulmonary:     Effort: Pulmonary effort is normal. No respiratory distress.     Breath sounds: Normal breath sounds. No wheezing or rhonchi.  Skin:    General: Skin is warm.  Neurological:     Mental Status: She is alert and oriented to person, place, and time.  Psychiatric:        Mood and Affect: Mood normal.        Behavior: Behavior normal.      UC Treatments / Results  Labs (all labs ordered are listed, but only abnormal results are displayed) Labs Reviewed  POCT INFLUENZA A/B    EKG   Radiology No results found.  Procedures Procedures (including critical care time)  Medications Ordered in UC Medications - No data to display  Initial Impression /  Assessment and Plan / UC Course  I have reviewed the triage vital signs and the nursing notes.  Pertinent labs & imaging results that were available during my care of the patient were reviewed by me and considered in  my medical decision making (see chart for details).     Final Clinical Impressions(s) / UC Diagnoses   Final diagnoses:  Viral URI   Discharge Instructions   None    ED Prescriptions   None    PDMP not reviewed this encounter.    [1]  Social History Tobacco Use   Smoking status: Never   Smokeless tobacco: Never  Vaping Use   Vaping status: Never Used  Substance Use Topics   Alcohol use: Not Currently    Comment: RARE   Drug use: No     Andra Corean BROCKS, PA-C 02/13/24 9083  "

## 2024-02-13 NOTE — Discharge Instructions (Signed)
 Today you have been diagnosed with a musculoskeletal injury.  Adults may use 400 mg of ibuprofen  and 1000 mg of Tylenol  together every 8 hours as needed for pain control.  Children may take ibuprofen  and Tylenol  as directed on medication packaging.  You should use ice on affected area for 20 minutes at a time a couple times a day for the first 24 hours then you may switch to heat in the same intervals.  Be sure to put a barrier between ice or heat source and skin to prevent burns.  May also wrap affected area and Ace bandage if tolerated and appropriate, and elevate above the level of the heart to help reduce swelling.  Do not wrap Ace bandages around neck or torso as wrapping too tight can restrict air movement inability to breathe.  If symptoms do not seem to be improving in 3 to 5 days after following these instructions we need to follow-up with orthopedist or PCP.

## 2024-03-03 ENCOUNTER — Encounter: Payer: Self-pay | Admitting: Family Medicine

## 2024-03-03 ENCOUNTER — Ambulatory Visit: Admitting: Family Medicine

## 2024-03-03 VITALS — BP 132/78 | HR 66 | Ht 64.0 in | Wt 148.4 lb

## 2024-03-03 DIAGNOSIS — J209 Acute bronchitis, unspecified: Secondary | ICD-10-CM

## 2024-03-03 MED ORDER — ALBUTEROL SULFATE HFA 108 (90 BASE) MCG/ACT IN AERS: 2.0000 | INHALATION_SPRAY | Freq: Four times a day (QID) | RESPIRATORY_TRACT | 0 refills | Status: DC | PRN

## 2024-03-03 MED ORDER — CLARITHROMYCIN 500 MG PO TABS: 500.0000 mg | ORAL_TABLET | Freq: Two times a day (BID) | ORAL | 0 refills | Status: AC

## 2024-03-03 NOTE — Progress Notes (Signed)
" ° °  Subjective:    Patient ID: Barbara Roman, female    DOB: 17-Dec-1955, 69 y.o.   MRN: [id number]  Discussed the use of AI scribe software for clinical note transcription with the patient, who gave verbal consent to proceed.  History of Present Illness   Barbara Roman is a 69 year old female who presents with a persistent cough lasting three weeks.  The cough began approximately three weeks ago, around the week of Christmas, initially accompanied by a runny nose. The runny nose resolved with self-medication, but the cough persisted.  Two weeks ago, she visited urgent care and tested negative for influenza. At that time, she did not have a fever or chills. Since then, she has been taking Mucinex Cold and Flu, Robitussin with [place], and cough drops, but the cough remains.  The cough is described as dry and aching, occurring intermittently throughout the day without worsening at night. Occasionally, she produces clear sputum when coughing or blowing her nose. Initially, the cough was severe enough to cause pain on her side, but this has since resolved.  No fever, chills, sore throat, or earache.           Review of Systems     Objective:    Physical Exam Alert and in no distress. Tympanic membranes and canals are normal. Pharyngeal area is normal. Neck is supple without adenopathy or thyromegaly. Cardiac exam shows a regular sinus rhythm without murmurs or gallops. Lungs are clear to auscultation.            Assessment & Plan:     Acute bronchitis Persistent dry cough for three weeks, negative for COVID-19 and influenza. History suggests possible secondary bacterial infection. - Prescribed Biaxin  for 10 days. - Recommended Robitussin DM and Nyquil at night. - Prescribed inhaler for breathing. - Advised to complete antibiotics and report persistent symptoms.  Hyperlipidemia LDL levels well-controlled with Crestor . Current management sufficient. - Continue  Crestor       "

## 2024-03-07 ENCOUNTER — Encounter: Payer: Self-pay | Admitting: Family Medicine

## 2024-03-25 ENCOUNTER — Other Ambulatory Visit: Payer: Self-pay | Admitting: Family Medicine

## 2024-03-25 DIAGNOSIS — J209 Acute bronchitis, unspecified: Secondary | ICD-10-CM

## 2024-08-02 ENCOUNTER — Ambulatory Visit: Payer: Self-pay

## 2024-08-24 ENCOUNTER — Ambulatory Visit: Payer: Self-pay | Admitting: Family Medicine
